# Patient Record
Sex: Male | Born: 1965 | Race: White | Hispanic: No | Marital: Single | Smoking: Never smoker
Health system: Southern US, Community
[De-identification: ages and names within clinical notes are randomized; demographics above are authoritative.]

## PROBLEM LIST (undated history)

## (undated) DIAGNOSIS — Z6841 Body Mass Index (BMI) 40.0 and over, adult: Secondary | ICD-10-CM

## (undated) DIAGNOSIS — N200 Calculus of kidney: Secondary | ICD-10-CM

## (undated) DIAGNOSIS — N289 Disorder of kidney and ureter, unspecified: Secondary | ICD-10-CM

## (undated) DIAGNOSIS — R7303 Prediabetes: Secondary | ICD-10-CM

## (undated) DIAGNOSIS — Z6838 Body mass index (BMI) 38.0-38.9, adult: Secondary | ICD-10-CM

## (undated) DIAGNOSIS — I10 Essential (primary) hypertension: Secondary | ICD-10-CM

## (undated) DIAGNOSIS — K567 Ileus, unspecified: Secondary | ICD-10-CM

## (undated) DIAGNOSIS — K9189 Other postprocedural complications and disorders of digestive system: Secondary | ICD-10-CM

---

## 2011-12-28 ENCOUNTER — Encounter (HOSPITAL_BASED_OUTPATIENT_CLINIC_OR_DEPARTMENT_OTHER): Payer: Self-pay | Admitting: *Deleted

## 2011-12-28 ENCOUNTER — Emergency Department (HOSPITAL_BASED_OUTPATIENT_CLINIC_OR_DEPARTMENT_OTHER): Payer: Self-pay

## 2011-12-28 ENCOUNTER — Inpatient Hospital Stay (HOSPITAL_BASED_OUTPATIENT_CLINIC_OR_DEPARTMENT_OTHER)
Admission: EM | Admit: 2011-12-28 | Discharge: 2012-01-07 | DRG: 339 | Disposition: A | Discharge status: Home / Self Care | Payer: MEDICAID | Attending: General Surgery | Admitting: General Surgery

## 2011-12-28 ENCOUNTER — Encounter (HOSPITAL_COMMUNITY): Admission: EM | Disposition: A | Discharge status: Home / Self Care | Payer: Self-pay | Source: Home / Self Care

## 2011-12-28 ENCOUNTER — Emergency Department (HOSPITAL_COMMUNITY): Payer: Self-pay | Admitting: Anesthesiology

## 2011-12-28 ENCOUNTER — Encounter (HOSPITAL_COMMUNITY): Payer: Self-pay | Admitting: Anesthesiology

## 2011-12-28 DIAGNOSIS — R7309 Other abnormal glucose: Secondary | ICD-10-CM | POA: Diagnosis present

## 2011-12-28 DIAGNOSIS — K3532 Acute appendicitis with perforation and localized peritonitis, without abscess: Secondary | ICD-10-CM | POA: Diagnosis present

## 2011-12-28 DIAGNOSIS — N289 Disorder of kidney and ureter, unspecified: Secondary | ICD-10-CM | POA: Clinically undetermined

## 2011-12-28 DIAGNOSIS — K3533 Acute appendicitis with perforation and localized peritonitis, with abscess: Principal | ICD-10-CM | POA: Diagnosis present

## 2011-12-28 DIAGNOSIS — K560 Paralytic ileus: Secondary | ICD-10-CM | POA: Diagnosis not present

## 2011-12-28 DIAGNOSIS — K567 Ileus, unspecified: Secondary | ICD-10-CM | POA: Clinically undetermined

## 2011-12-28 DIAGNOSIS — K3580 Unspecified acute appendicitis: Secondary | ICD-10-CM

## 2011-12-28 DIAGNOSIS — R7303 Prediabetes: Secondary | ICD-10-CM

## 2011-12-28 DIAGNOSIS — N179 Acute kidney failure, unspecified: Secondary | ICD-10-CM | POA: Diagnosis not present

## 2011-12-28 DIAGNOSIS — N200 Calculus of kidney: Secondary | ICD-10-CM | POA: Diagnosis present

## 2011-12-28 DIAGNOSIS — Z6838 Body mass index (BMI) 38.0-38.9, adult: Secondary | ICD-10-CM

## 2011-12-28 DIAGNOSIS — K352 Acute appendicitis with generalized peritonitis, without abscess: Secondary | ICD-10-CM

## 2011-12-28 DIAGNOSIS — E860 Dehydration: Secondary | ICD-10-CM | POA: Diagnosis not present

## 2011-12-28 DIAGNOSIS — I10 Essential (primary) hypertension: Secondary | ICD-10-CM

## 2011-12-28 HISTORY — DX: Prediabetes: R73.03

## 2011-12-28 HISTORY — DX: Ileus, unspecified: K56.7

## 2011-12-28 HISTORY — DX: Disorder of kidney and ureter, unspecified: N28.9

## 2011-12-28 HISTORY — DX: Other postprocedural complications and disorders of digestive system: K91.89

## 2011-12-28 HISTORY — DX: Body mass index (bmi) 38.0-38.9, adult: Z68.38

## 2011-12-28 HISTORY — DX: Calculus of kidney: N20.0

## 2011-12-28 HISTORY — PX: LAPAROSCOPIC APPENDECTOMY: SHX408

## 2011-12-28 HISTORY — DX: Body Mass Index (BMI) 40.0 and over, adult: Z684

## 2011-12-28 HISTORY — DX: Essential (primary) hypertension: I10

## 2011-12-28 HISTORY — DX: Morbid (severe) obesity due to excess calories: E66.01

## 2011-12-28 LAB — COMPREHENSIVE METABOLIC PANEL
AST: 14 U/L (ref 0–37)
Albumin: 3.5 g/dL (ref 3.5–5.2)
BUN: 46 mg/dL — ABNORMAL HIGH (ref 6–23)
CO2: 28 mEq/L (ref 19–32)
Calcium: 10.2 mg/dL (ref 8.4–10.5)
Chloride: 89 mEq/L — ABNORMAL LOW (ref 96–112)
Creatinine, Ser: 1.9 mg/dL — ABNORMAL HIGH (ref 0.50–1.35)
GFR calc non Af Amer: 40 mL/min — ABNORMAL LOW (ref >90)
Total Bilirubin: 1.6 mg/dL — ABNORMAL HIGH (ref 0.3–1.2)

## 2011-12-28 LAB — CBC WITH DIFFERENTIAL/PLATELET
Band Neutrophils: 0 % (ref 0–10)
Basophils Relative: 0 % (ref 0–1)
Eosinophils Absolute: 0 10*3/uL (ref 0.0–0.7)
Eosinophils Relative: 0 % (ref 0–5)
HCT: 48.5 % (ref 39.0–52.0)
Hemoglobin: 17.7 g/dL — ABNORMAL HIGH (ref 13.0–17.0)
Lymphocytes Relative: 15 % (ref 12–46)
Lymphs Abs: 3.3 10*3/uL (ref 0.7–4.0)
MCV: 82.3 fL (ref 78.0–100.0)
Metamyelocytes Relative: 0 %
Monocytes Absolute: 0.9 10*3/uL (ref 0.1–1.0)
Monocytes Relative: 4 % (ref 3–12)
RBC: 5.89 MIL/uL — ABNORMAL HIGH (ref 4.22–5.81)
WBC: 21.9 10*3/uL — ABNORMAL HIGH (ref 4.0–10.5)

## 2011-12-28 LAB — URINALYSIS, ROUTINE W REFLEX MICROSCOPIC
Protein, ur: 100 mg/dL — AB
Specific Gravity, Urine: 1.037 — ABNORMAL HIGH (ref 1.005–1.030)
Urobilinogen, UA: 1 mg/dL (ref 0.0–1.0)

## 2011-12-28 LAB — URINE MICROSCOPIC-ADD ON

## 2011-12-28 LAB — LIPASE, BLOOD: Lipase: 41 U/L (ref 11–59)

## 2011-12-28 SURGERY — APPENDECTOMY, LAPAROSCOPIC
Anesthesia: General | Site: Abdomen | Wound class: Dirty or Infected

## 2011-12-28 MED ORDER — ROCURONIUM BROMIDE 100 MG/10ML IV SOLN
INTRAVENOUS | Status: DC | PRN
  Administered 2011-12-28: 35 mg via INTRAVENOUS
  Administered 2011-12-28: 10 mg via INTRAVENOUS

## 2011-12-28 MED ORDER — SODIUM CHLORIDE 0.9 % IV SOLN
1.0000 g | Freq: Once | INTRAVENOUS | Status: AC
  Administered 2011-12-28: 1 g via INTRAVENOUS
  Filled 2011-12-28: qty 1

## 2011-12-28 MED ORDER — MORPHINE SULFATE (PF) 1 MG/ML IV SOLN
INTRAVENOUS | Status: DC
  Administered 2011-12-28: 21:00:00 via INTRAVENOUS

## 2011-12-28 MED ORDER — SODIUM CHLORIDE 0.9 % IV BOLUS (SEPSIS)
2000.0000 mL | Freq: Once | INTRAVENOUS | Status: AC
  Administered 2011-12-28: 1000 mL via INTRAVENOUS

## 2011-12-28 MED ORDER — SODIUM CHLORIDE 0.9 % IV SOLN
Freq: Once | INTRAVENOUS | Status: AC
  Administered 2011-12-28: 15:00:00 via INTRAVENOUS

## 2011-12-28 MED ORDER — MIDAZOLAM HCL 5 MG/5ML IJ SOLN
INTRAMUSCULAR | Status: DC | PRN
  Administered 2011-12-28: 2 mg via INTRAVENOUS

## 2011-12-28 MED ORDER — LACTATED RINGERS IR SOLN
Status: DC | PRN
  Administered 2011-12-28: 1000 mL

## 2011-12-28 MED ORDER — FENTANYL CITRATE 0.05 MG/ML IJ SOLN
INTRAMUSCULAR | Status: DC | PRN
  Administered 2011-12-28: 50 ug via INTRAVENOUS
  Administered 2011-12-28: 100 ug via INTRAVENOUS
  Administered 2011-12-28 (×2): 50 ug via INTRAVENOUS

## 2011-12-28 MED ORDER — SODIUM CHLORIDE 0.9 % IV SOLN
1.0000 g | INTRAVENOUS | Status: DC
  Administered 2011-12-29 – 2012-01-06 (×9): 1 g via INTRAVENOUS
  Filled 2011-12-28 (×10): qty 1

## 2011-12-28 MED ORDER — SODIUM CHLORIDE 0.9 % IV SOLN
INTRAVENOUS | Status: DC
  Administered 2011-12-29 – 2011-12-30 (×6): via INTRAVENOUS

## 2011-12-28 MED ORDER — ONDANSETRON HCL 4 MG/2ML IJ SOLN: 4.0000 mg | Freq: Four times a day (QID) | INTRAMUSCULAR | Status: DC | PRN

## 2011-12-28 MED ORDER — LIDOCAINE-EPINEPHRINE 1 %-1:100000 IJ SOLN
INTRAMUSCULAR | Status: DC | PRN
  Administered 2011-12-28: 30 mL

## 2011-12-28 MED ORDER — LIDOCAINE-EPINEPHRINE 1 %-1:100000 IJ SOLN
INTRAMUSCULAR | Status: AC
  Filled 2011-12-28: qty 1

## 2011-12-28 MED ORDER — PROPOFOL 10 MG/ML IV EMUL
INTRAVENOUS | Status: DC | PRN
  Administered 2011-12-28: 200 mg via INTRAVENOUS

## 2011-12-28 MED ORDER — ACETAMINOPHEN 325 MG PO TABS
650.0000 mg | ORAL_TABLET | Freq: Once | ORAL | Status: AC
  Administered 2011-12-28: 650 mg via ORAL

## 2011-12-28 MED ORDER — DIPHENHYDRAMINE HCL 50 MG/ML IJ SOLN: 12.5000 mg | Freq: Four times a day (QID) | INTRAMUSCULAR | Status: DC | PRN

## 2011-12-28 MED ORDER — GLYCOPYRROLATE 0.2 MG/ML IJ SOLN
INTRAMUSCULAR | Status: DC | PRN
  Administered 2011-12-28: 0.6 mg via INTRAVENOUS

## 2011-12-28 MED ORDER — MORPHINE SULFATE (PF) 1 MG/ML IV SOLN
INTRAVENOUS | Status: AC
  Filled 2011-12-28: qty 25

## 2011-12-28 MED ORDER — HYDROMORPHONE HCL PF 1 MG/ML IJ SOLN: 0.2500 mg | INTRAMUSCULAR | Status: DC | PRN

## 2011-12-28 MED ORDER — SODIUM CHLORIDE 0.9 % IV SOLN
Freq: Once | INTRAVENOUS | Status: AC
  Administered 2011-12-28: 1000 mL via INTRAVENOUS

## 2011-12-28 MED ORDER — SODIUM CHLORIDE 0.9 % IJ SOLN: 9.0000 mL | INTRAMUSCULAR | Status: DC | PRN

## 2011-12-28 MED ORDER — DEXAMETHASONE SODIUM PHOSPHATE 10 MG/ML IJ SOLN
INTRAMUSCULAR | Status: DC | PRN
  Administered 2011-12-28: 10 mg via INTRAVENOUS

## 2011-12-28 MED ORDER — BUPIVACAINE HCL (PF) 0.25 % IJ SOLN
INTRAMUSCULAR | Status: AC
  Filled 2011-12-28: qty 30

## 2011-12-28 MED ORDER — ONDANSETRON HCL 4 MG/2ML IJ SOLN
4.0000 mg | Freq: Once | INTRAMUSCULAR | Status: AC
  Administered 2011-12-28: 4 mg via INTRAVENOUS

## 2011-12-28 MED ORDER — NALOXONE HCL 0.4 MG/ML IJ SOLN: 0.4000 mg | INTRAMUSCULAR | Status: DC | PRN

## 2011-12-28 MED ORDER — HEPARIN SODIUM (PORCINE) 5000 UNIT/ML IJ SOLN
5000.0000 [IU] | Freq: Three times a day (TID) | INTRAMUSCULAR | Status: DC
  Administered 2011-12-28 – 2012-01-06 (×26): 5000 [IU] via SUBCUTANEOUS
  Filled 2011-12-28 (×29): qty 1

## 2011-12-28 MED ORDER — DIPHENHYDRAMINE HCL 12.5 MG/5ML PO ELIX
12.5000 mg | ORAL_SOLUTION | Freq: Four times a day (QID) | ORAL | Status: DC | PRN
  Filled 2011-12-28: qty 5

## 2011-12-28 MED ORDER — SUCCINYLCHOLINE CHLORIDE 20 MG/ML IJ SOLN
INTRAMUSCULAR | Status: DC | PRN
  Administered 2011-12-28: 100 mg via INTRAVENOUS

## 2011-12-28 MED ORDER — EPHEDRINE SULFATE 50 MG/ML IJ SOLN
INTRAMUSCULAR | Status: DC | PRN
  Administered 2011-12-28: 15 mg via INTRAVENOUS

## 2011-12-28 MED ORDER — LACTATED RINGERS IV SOLN
INTRAVENOUS | Status: DC | PRN
  Administered 2011-12-28: 18:00:00 via INTRAVENOUS

## 2011-12-28 MED ORDER — NEOSTIGMINE METHYLSULFATE 1 MG/ML IJ SOLN
INTRAMUSCULAR | Status: DC | PRN
  Administered 2011-12-28: 5 mg via INTRAVENOUS

## 2011-12-28 MED ORDER — ONDANSETRON HCL 4 MG/2ML IJ SOLN
INTRAMUSCULAR | Status: AC
  Filled 2011-12-28: qty 2

## 2011-12-28 MED ORDER — BUPIVACAINE HCL 0.25 % IJ SOLN
INTRAMUSCULAR | Status: DC | PRN
  Administered 2011-12-28: 30 mL

## 2011-12-28 MED ORDER — ONDANSETRON HCL 4 MG PO TABS: 4.0000 mg | ORAL_TABLET | Freq: Four times a day (QID) | ORAL | Status: DC | PRN

## 2011-12-28 MED ORDER — ONDANSETRON HCL 4 MG/2ML IJ SOLN
INTRAMUSCULAR | Status: DC | PRN
  Administered 2011-12-28: 4 mg via INTRAVENOUS

## 2011-12-28 MED ORDER — ACETAMINOPHEN 325 MG PO TABS
ORAL_TABLET | ORAL | Status: AC
  Filled 2011-12-28: qty 2

## 2011-12-28 MED ORDER — KETOROLAC TROMETHAMINE 30 MG/ML IJ SOLN
30.0000 mg | Freq: Once | INTRAMUSCULAR | Status: AC
Start: 2011-12-28 — End: 2011-12-28
  Administered 2011-12-28: 30 mg via INTRAVENOUS
  Filled 2011-12-28: qty 1

## 2011-12-28 MED ORDER — 0.9 % SODIUM CHLORIDE (POUR BTL) OPTIME
TOPICAL | Status: DC | PRN
  Administered 2011-12-28: 1000 mL

## 2011-12-28 SURGICAL SUPPLY — 44 items
BENZOIN TINCTURE PRP APPL 2/3 (GAUZE/BANDAGES/DRESSINGS) ×2 IMPLANT
CABLE HIGH FREQUENCY MONO STRZ (ELECTRODE) ×2 IMPLANT
CANISTER SUCTION 2500CC (MISCELLANEOUS) ×2 IMPLANT
CHLORAPREP W/TINT 26ML (MISCELLANEOUS) ×2 IMPLANT
CLOTH BEACON ORANGE TIMEOUT ST (SAFETY) ×2 IMPLANT
CLSR STERI-STRIP ANTIMIC 1/2X4 (GAUZE/BANDAGES/DRESSINGS) ×2 IMPLANT
COVER SURGICAL LIGHT HANDLE (MISCELLANEOUS) ×2 IMPLANT
DECANTER SPIKE VIAL GLASS SM (MISCELLANEOUS) ×2 IMPLANT
DERMABOND ADVANCED (GAUZE/BANDAGES/DRESSINGS) ×1
DERMABOND ADVANCED .7 DNX12 (GAUZE/BANDAGES/DRESSINGS) ×1 IMPLANT
DRAIN CHANNEL 19F RND (DRAIN) ×2 IMPLANT
DRAPE LAPAROSCOPIC ABDOMINAL (DRAPES) ×2 IMPLANT
ELECT CAUTERY BLADE 6.4 (BLADE) ×2 IMPLANT
ELECT REM PT RETURN 9FT ADLT (ELECTROSURGICAL) ×2
ELECTRODE REM PT RTRN 9FT ADLT (ELECTROSURGICAL) ×1 IMPLANT
ENDO GIA UNIVERSAL XLG (ENDOMECHANICALS) ×2 IMPLANT
EVACUATOR SILICONE 100CC (DRAIN) ×2 IMPLANT
FILTER SMOKE EVAC LAPAROSHD (FILTER) IMPLANT
GLOVE BIOGEL PI IND STRL 7.0 (GLOVE) ×1 IMPLANT
GLOVE BIOGEL PI INDICATOR 7.0 (GLOVE) ×1
GLOVE SURG SS PI 7.5 STRL IVOR (GLOVE) ×8 IMPLANT
GOWN PREVENTION PLUS LG XLONG (DISPOSABLE) ×2 IMPLANT
GOWN STRL NON-REIN LRG LVL3 (GOWN DISPOSABLE) ×2 IMPLANT
GOWN STRL REIN XL XLG (GOWN DISPOSABLE) ×4 IMPLANT
GRASPER LAPSCPC 5X35 EPIX (ENDOMECHANICALS) IMPLANT
KIT BASIN OR (CUSTOM PROCEDURE TRAY) ×2 IMPLANT
NS IRRIG 1000ML POUR BTL (IV SOLUTION) ×2 IMPLANT
PENCIL BUTTON HOLSTER BLD 10FT (ELECTRODE) ×2 IMPLANT
POUCH SPECIMEN RETRIEVAL 10MM (ENDOMECHANICALS) ×2 IMPLANT
RELOAD EGIA 45 MED/THCK PURPLE (STAPLE) ×2 IMPLANT
RELOAD EGIA 45 TAN VASC (STAPLE) IMPLANT
SCALPEL HARMONIC ACE (MISCELLANEOUS) ×2 IMPLANT
SCISSORS LAP 5X35 DISP (ENDOMECHANICALS) ×2 IMPLANT
SET IRRIG TUBING LAPAROSCOPIC (IRRIGATION / IRRIGATOR) ×2 IMPLANT
SLEEVE Z-THREAD 5X100MM (TROCAR) ×2 IMPLANT
SOLUTION ANTI FOG 6CC (MISCELLANEOUS) ×2 IMPLANT
SUT ETHILON 2 0 PS N (SUTURE) ×2 IMPLANT
SUT MNCRL AB 4-0 PS2 18 (SUTURE) ×2 IMPLANT
TOWEL OR 17X26 10 PK STRL BLUE (TOWEL DISPOSABLE) ×2 IMPLANT
TRAY FOLEY CATH 14FRSI W/METER (CATHETERS) ×2 IMPLANT
TRAY LAP CHOLE (CUSTOM PROCEDURE TRAY) ×2 IMPLANT
TROCAR BALLN 12MMX100 BLUNT (TROCAR) ×2 IMPLANT
TROCAR Z-THREAD FIOS 5X100MM (TROCAR) ×6 IMPLANT
TUBING INSUFFLATION 10FT LAP (TUBING) ×2 IMPLANT

## 2011-12-28 NOTE — Anesthesia Postprocedure Evaluation (Signed)
  Anesthesia Post-op Note  Patient: Steve Rogers  Procedure(s) Performed: Procedure(s) (LRB): APPENDECTOMY LAPAROSCOPIC (N/A)  Patient Location: PACU  Anesthesia Type: General  Level of Consciousness: oriented and sedated  Airway and Oxygen Therapy: Patient Spontanous Breathing and Patient connected to nasal cannula oxygen  Post-op Pain: mild  Post-op Assessment: Post-op Vital signs reviewed, Patient's Cardiovascular Status Stable, Respiratory Function Stable and Patent Airway  Post-op Vital Signs: stable  Complications: No apparent anesthesia complications

## 2011-12-28 NOTE — Transfer of Care (Signed)
Immediate Anesthesia Transfer of Care Note  Patient: Steve Rogers  Procedure(s) Performed: Procedure(s) (LRB): APPENDECTOMY LAPAROSCOPIC (N/A)  Patient Location: PACU  Anesthesia Type: General  Level of Consciousness: sedated, patient cooperative and responds to stimulaton  Airway & Oxygen Therapy: Patient Spontanous Breathing and Patient connected to face mask oxgen  Post-op Assessment: Report given to PACU RN and Post -op Vital signs reviewed and stable  Post vital signs: Reviewed and stable  Complications: No apparent anesthesia complications

## 2011-12-28 NOTE — H&P (Signed)
Reason for Consult:appendicitis Referring Physician: Dr. Colin Ina is an 46 y.o. male.  HPI: this patient was transferred from [facility] for evaluation of abdominal pain and to rule out appendicitis. He was in his usual state of health until Thursday afternoon when he began having nausea and vomiting. Symptoms lasted about 24 hours. That his nausea and vomiting improved but he began having generalized abdominal pain which he describes as "soreness".  He thought that this was due to all of the vomiting. He says that this has progressively increased and he has not been able to take much food or drink. He has had fevers and chills as well. He has been constipated but took some stool softeners and had 3 bowel movements today. He denies any blood in the stools or melena. At that center [facility] he had a CT scan which was concerning for acute appendicitis. Over there, he was tachycardic with his heart rate in the 130s and showed evidence of dehydration. He received some fluid antibiotic and was transferred for surgical evaluation.  History reviewed. No pertinent past medical history.  History reviewed. No pertinent past surgical history.  No family history on file.  Social History:  reports that he has never smoked. He does not have any smokeless tobacco history on file. His alcohol and drug histories not on file.  Allergies: No Known Allergies  Medications: I have reviewed the patient's current medications.  Results for orders placed during the hospital encounter of 12/28/11 (from the past 48 hour(s))  CBC WITH DIFFERENTIAL     Status: Abnormal   Collection Time   12/28/11 12:29 PM      Component Value Range Comment   WBC 21.9 (*) 4.0 - 10.5 K/uL WHITE COUNT CONFIRMED ON SMEAR   RBC 5.89 (*) 4.22 - 5.81 MIL/uL    Hemoglobin 17.7 (*) 13.0 - 17.0 g/dL    HCT 16.1  09.6 - 04.5 %    MCV 82.3  78.0 - 100.0 fL    MCH 30.1  26.0 - 34.0 pg    MCHC 36.5 (*) 30.0 - 36.0 g/dL      RDW 40.9  81.1 - 91.4 %    Platelets 410 (*) 150 - 400 K/uL    Neutrophils Relative 81 (*) 43 - 77 %    Lymphocytes Relative 15  12 - 46 %    Monocytes Relative 4  3 - 12 %    Eosinophils Relative 0  0 - 5 %    Basophils Relative 0  0 - 1 %    Band Neutrophils 0  0 - 10 %    Metamyelocytes Relative 0      Myelocytes 0      Promyelocytes Absolute 0      Blasts 0      nRBC 0  0 /100 WBC    Neutro Abs 17.7 (*) 1.7 - 7.7 K/uL    Lymphs Abs 3.3  0.7 - 4.0 K/uL    Monocytes Absolute 0.9  0.1 - 1.0 K/uL    Eosinophils Absolute 0.0  0.0 - 0.7 K/uL    Basophils Absolute 0.0  0.0 - 0.1 K/uL    Smear Review LARGE PLATELETS PRESENT     LIPASE, BLOOD     Status: Normal   Collection Time   12/28/11 12:29 PM      Component Value Range Comment   Lipase 41  11 - 59 U/L   COMPREHENSIVE METABOLIC PANEL  Status: Abnormal   Collection Time   12/28/11 12:29 PM      Component Value Range Comment   Sodium 132 (*) 135 - 145 mEq/L    Potassium 3.5  3.5 - 5.1 mEq/L    Chloride 89 (*) 96 - 112 mEq/L    CO2 28  19 - 32 mEq/L    Glucose, Bld 182 (*) 70 - 99 mg/dL    BUN 46 (*) 6 - 23 mg/dL    Creatinine, Ser 9.60 (*) 0.50 - 1.35 mg/dL    Calcium 45.4  8.4 - 10.5 mg/dL    Total Protein 8.3  6.0 - 8.3 g/dL    Albumin 3.5  3.5 - 5.2 g/dL    AST 14  0 - 37 U/L    ALT 26  0 - 53 U/L    Alkaline Phosphatase 72  39 - 117 U/L    Total Bilirubin 1.6 (*) 0.3 - 1.2 mg/dL    GFR calc non Af Amer 40 (*) >90 mL/min    GFR calc Af Amer 47 (*) >90 mL/min   URINALYSIS, ROUTINE W REFLEX MICROSCOPIC     Status: Abnormal   Collection Time   12/28/11  1:07 PM      Component Value Range Comment   Color, Urine AMBER (*) YELLOW BIOCHEMICALS MAY BE AFFECTED BY COLOR   APPearance TURBID (*) CLEAR    Specific Gravity, Urine 1.037 (*) 1.005 - 1.030    pH 5.0  5.0 - 8.0    Glucose, UA NEGATIVE  NEGATIVE mg/dL    Hgb urine dipstick LARGE (*) NEGATIVE    Bilirubin Urine MODERATE (*) NEGATIVE    Ketones, ur 15 (*) NEGATIVE  mg/dL    Protein, ur 098 (*) NEGATIVE mg/dL    Urobilinogen, UA 1.0  0.0 - 1.0 mg/dL    Nitrite POSITIVE (*) NEGATIVE    Leukocytes, UA SMALL (*) NEGATIVE   URINE MICROSCOPIC-ADD ON     Status: Abnormal   Collection Time   12/28/11  1:07 PM      Component Value Range Comment   Squamous Epithelial / LPF RARE  RARE    WBC, UA 3-6  <3 WBC/hpf    RBC / HPF 11-20  <3 RBC/hpf    Bacteria, UA MANY (*) RARE    Casts HYALINE CASTS (*) NEGATIVE     Ct Abdomen Pelvis Wo Contrast  12/28/2011  *RADIOLOGY REPORT*  Clinical Data: Abdominal pain, nausea, elevated creatinine  CT ABDOMEN AND PELVIS WITHOUT CONTRAST  Technique:  Multidetector CT imaging of the abdomen and pelvis was performed following the standard protocol without intravenous contrast.  Comparison: None.  Findings: Scarring or subsegmental atelectasis posteriorly in the visualized lung bases.  Unremarkable uninfused evaluation of liver, gallbladder, adrenal glands, pancreas.  There is high attenuation material in the lumen of the gallbladder.  Left nephrolithiasis, 6 mm calculus in the lower pole renal collecting system.  2 cm fluid attenuation lesion in the interpolar region left kidney. No hydronephrosis.  Ureters decompressed without calculus.  Stomach is nondistended.  There are multiple mid abdominal distended small bowel loops and a single dilated loop of small bowel in the mid abdomen measuring 5 cm transverse diameter.  Loops of more distal ileum are surrounded by mild inflammatory/edematous changes.  The appendix is dilated up to 18 mm diameter, with mild wall thickening, containing gas and appendicoliths with adjacent inflammatory/edematous change.  No definite extraluminal fluid collections.  The colon is decompressed.  Urinary bladder  incompletely distended.  No free air.  No ascites.  IMPRESSION:  1.  Distended appendix with appendicoliths, wall thickening, and adjacent inflammatory/inflammatory changes suggesting acute appendicitis.  No  evidence of perforation or abscess. 2.  Left nephrolithiasis without hydronephrosis.  Original Report Authenticated By: Osa Craver, M.D.    All other review of systems negative or noncontributory except as stated in the HPI'  Blood pressure 150/87, pulse 106, temperature 98.5 F (36.9 C), temperature source Oral, SpO2 97.00%. General appearance: alert, cooperative and no distress Head: Normocephalic, without obvious abnormality, atraumatic Neck: no JVD and supple, symmetrical, trachea midline Resp: clear to auscultation bilaterally Cardio: mild tachy, regular GI: soft, diffuse mild tenderness, no focal tenderness, mild distension, no peritoneal signs Extremities: extremities normal, atraumatic, no cyanosis or edema Neurologic: Grossly normal  Assessment/Plan: Abdominal pain, likely acute appendicitis. He has had 3 days of abdominal pain and nausea and vomiting and fevers and chills and a history and physical exam concerning for acute appendicitis. Given at a CT scan and his laboratory studies I have recommended diagnostic laparoscopy and appendectomy for treatment of appendicitis. I explained that I have concern for possible perforation given the duration and has diffuse tenderness as well as the CT scan findings. I discussed with him the planned procedure and the risks the benefits and he expressed understanding and would like to proceed with appendectomy. I discussed with him the risks of infection, bleeding, pain, scarring, persistent symptoms, negative laparoscopy, need for open surgery, abscess, bowel injury and he expressed understanding and would like to proceed with diagnostic laparoscopy and appendectomy. In the meantime, we will provide IV hydration and antibiotics and we will proceed with appendectomy as soon as possible.  Lodema Pilot DAVID 12/28/2011, 4:25 PM

## 2011-12-28 NOTE — ED Notes (Signed)
[facility] has been called to transport patient to [facility] ED--[facility] is out of [place]

## 2011-12-28 NOTE — Anesthesia Preprocedure Evaluation (Signed)
Anesthesia Evaluation  Patient identified by MRN, date of birth, ID band Patient awake  General Assessment Comment:NPO today  Reviewed: Allergy & Precautions, H&P , NPO status , Patient's Chart, lab work & pertinent test results, reviewed documented beta blocker date and time   Airway Mallampati: II TM Distance: >3 FB     Dental  (+) Teeth Intact   Pulmonary neg pulmonary ROS,  breath sounds clear to auscultation        Cardiovascular negative cardio ROS  Rhythm:Regular Rate:Normal  Denies cardiac symptopms   Neuro/Psych negative neurological ROS  negative psych ROS   GI/Hepatic Neg liver ROS, appendicitis   Endo/Other  Morbid obesity  Renal/GU Elevated Cr 1.90  negative genitourinary   Musculoskeletal negative musculoskeletal ROS (+)   Abdominal   Peds negative pediatric ROS (+)  Hematology negative hematology ROS (+)   Anesthesia Other Findings   Reproductive/Obstetrics negative OB ROS                           Anesthesia Physical Anesthesia Plan  ASA: II and Emergent  Anesthesia Plan:    Post-op Pain Management:    Induction: Intravenous, Rapid sequence and Cricoid pressure planned  Airway Management Planned: Oral ETT  Additional Equipment:   Intra-op Plan:   Post-operative Plan: Extubation in OR  Informed Consent: I have reviewed the patients History and Physical, chart, labs and discussed the procedure including the risks, benefits and alternatives for the proposed anesthesia with the patient or authorized representative who has indicated his/her understanding and acceptance.   Dental advisory given  Plan Discussed with: CRNA and Surgeon  Anesthesia Plan Comments:         Anesthesia Quick Evaluation

## 2011-12-28 NOTE — ED Notes (Signed)
Report received from [facility]

## 2011-12-28 NOTE — ED Notes (Signed)
[id number]<AV> Expected date:12/28/11<BR> Expected time: 3:34 PM<BR> Means of arrival:Ambulance<BR> Comments:<BR> apendicitis

## 2011-12-28 NOTE — Brief Op Note (Signed)
12/28/2011  8:12 PM  PATIENT:  Steve Rogers  46 y.o. male  PRE-OPERATIVE DIAGNOSIS:  appendicitis  POST-OPERATIVE DIAGNOSIS:  appendicitis  PROCEDURE:  Procedure(s) (LRB): APPENDECTOMY LAPAROSCOPIC (N/A)  SURGEON:  Surgeon(s) and Role:    * Lodema Pilot, DO - Primary  PHYSICIAN ASSISTANT:   ASSISTANTS: none   ANESTHESIA:   general  EBL:  Total I/O In: 2100 [I.V.:2100] Out: 475 [Urine:175; Other:300]  BLOOD ADMINISTERED:none  DRAINS: (61F) Jackson-Pratt drain(s) with closed bulb suction in the abscess cavity   LOCAL MEDICATIONS USED:  MARCAINE    and LIDOCAINE   SPECIMEN:  Source of Specimen:  appendix  DISPOSITION OF SPECIMEN:  PATHOLOGY  COUNTS:  YES  TOURNIQUET:  * No tourniquets in log *  DICTATION: .Other Dictation: Dictation Number   PLAN OF CARE: Admit to inpatient   PATIENT DISPOSITION:  PACU - hemodynamically stable.   Delay start of Pharmacological VTE agent (>24hrs) due to surgical blood loss or risk of bleeding: no

## 2011-12-28 NOTE — ED Notes (Signed)
Patient has had N/V/D since Thursday. Unable to hold fluids down at all.

## 2011-12-28 NOTE — ED Provider Notes (Signed)
History     CSN: [id number]  Arrival date & time 12/28/11  1148   First MD Initiated Contact with Patient 12/28/11 1222      Chief Complaint  Patient presents with  . Emesis    (Consider location/radiation/quality/duration/timing/severity/associated sxs/prior treatment) HPI Comments: 3 day history of not n/v/d, all non-bloody.  States has been unable to keep anything down.  Unsure if he ate something bad.  No sick contacts.    Patient is a 46 y.o. male presenting with vomiting. The history is provided by the patient.  Emesis  This is a new problem. Episode onset: 3 days ago. The problem occurs continuously. The problem has been gradually worsening. The emesis has an appearance of stomach contents. There has been no fever. Associated symptoms include chills and diarrhea. Pertinent negatives include no abdominal pain and no fever.    History reviewed. No pertinent past medical history.  History reviewed. No pertinent past surgical history.  No family history on file.  History  Substance Use Topics  . Smoking status: Never Smoker   . Smokeless tobacco: Not on file  . Alcohol Use:       Review of Systems  Constitutional: Positive for chills. Negative for fever.  Gastrointestinal: Positive for vomiting and diarrhea. Negative for abdominal pain.  All other systems reviewed and are negative.    Allergies  Review of patient's allergies indicates no known allergies.  Home Medications  No current outpatient prescriptions on file.  BP 138/94  Pulse 139  Temp 99.3 F (37.4 C) (Oral)  SpO2 97%  Physical Exam  Nursing note and vitals reviewed. Constitutional: He is oriented to person, place, and time. He appears well-developed and well-nourished. No distress.  HENT:  Head: Normocephalic and atraumatic.  Neck: Normal range of motion. Neck supple.  Cardiovascular: Regular rhythm.        Tachycardic in the 120's.  Pulmonary/Chest: Effort normal and breath sounds normal.  No respiratory distress.  Abdominal: Soft. Bowel sounds are normal. He exhibits no distension. There is no tenderness.  Musculoskeletal: Normal range of motion. He exhibits no edema.  Neurological: He is alert and oriented to person, place, and time.  Skin: Skin is warm and dry. He is not diaphoretic.    ED Course  Procedures (including critical care time)   Labs Reviewed  CBC WITH DIFFERENTIAL  LIPASE, BLOOD  COMPREHENSIVE METABOLIC PANEL  URINALYSIS, ROUTINE W REFLEX MICROSCOPIC   No results found.   No diagnosis found.    MDM  The patient presents with abd pain for the past three days.  He has been unable to eat or drink due to nausea.  The workup today reveals a leukocytosis, elevated bun/cr reflective of dehydration, and a ct scan that shows acute appendicitis.  He has been hydrated with 2L of NS.  I will consult general surgery to discuss transfer.  Patient accepted by Dr. Biagio Quint to [facility].        Geoffery Lyons, MD 12/28/11 1459

## 2011-12-29 ENCOUNTER — Encounter (HOSPITAL_COMMUNITY): Payer: Self-pay | Admitting: General Surgery

## 2011-12-29 LAB — COMPREHENSIVE METABOLIC PANEL
AST: 13 U/L (ref 0–37)
Albumin: 2.4 g/dL — ABNORMAL LOW (ref 3.5–5.2)
Alkaline Phosphatase: 60 U/L (ref 39–117)
Chloride: 99 mEq/L (ref 96–112)
Creatinine, Ser: 1.4 mg/dL — ABNORMAL HIGH (ref 0.50–1.35)
Potassium: 4.3 mEq/L (ref 3.5–5.1)
Total Bilirubin: 0.8 mg/dL (ref 0.3–1.2)
Total Protein: 6.1 g/dL (ref 6.0–8.3)

## 2011-12-29 LAB — CBC
HCT: 41.6 % (ref 39.0–52.0)
MCH: 29.4 pg (ref 26.0–34.0)
MCV: 86.1 fL (ref 78.0–100.0)
Platelets: 322 10*3/uL (ref 150–400)
RBC: 4.83 MIL/uL (ref 4.22–5.81)
RDW: 13.2 % (ref 11.5–15.5)
WBC: 14.6 10*3/uL — ABNORMAL HIGH (ref 4.0–10.5)

## 2011-12-29 MED ORDER — FAMOTIDINE IN NACL 20-0.9 MG/50ML-% IV SOLN
20.0000 mg | INTRAVENOUS | Status: DC
  Administered 2011-12-29 – 2012-01-06 (×9): 20 mg via INTRAVENOUS
  Filled 2011-12-29 (×11): qty 50

## 2011-12-29 NOTE — Discharge Summary (Signed)
General surgery attending note:  Patient interviewed and examined. I agree with treatment plan and assessment as outlined by Mr. [name].  On exam, abdomen is distended but relatively soft. Bowel sounds absent. JP draining serosanguineous fluid.  Continue NPO. Continue Invanz for several days.     Begin to ambulate. Incentive spirometry were ordered. Will get Foley catheter out today. He still has ileus and will be to leave the NG tube in. I discussed his operative findings with him. He seems to understand these issues.   Angelia Mould. Derrell Lolling, M.D., [facility] General and Minimally invasive Surgery Breast and Colorectal Surgery Office:   [phone] Pager:   [phone]

## 2011-12-29 NOTE — Care Management Note (Signed)
    Page 1 of 1   01/07/2012     2:13:41 PM   CARE MANAGEMENT NOTE 01/07/2012  Patient:  Steve Rogers, Steve Rogers   Account Number:  [id number]  Date Initiated:  12/29/2011  Documentation initiated by:  Lorenda Ishihara  Subjective/Objective Assessment:   46 yo male admitted s/p lap appendectomy with abscess. PTA lived at home with parents.     Action/Plan:   Anticipated DC Date:  01/07/2012   Anticipated DC Plan:  HOME/SELF CARE      DC Planning Services  CM consult      Choice offered to / List presented to:             Status of service:  In process, will continue to follow Medicare Important Message given?   (If response is "NO", the following Medicare IM given date fields will be blank) Date Medicare IM given:   Date Additional Medicare IM given:    Discharge Disposition:  HOME/SELF CARE  Per UR Regulation:  Reviewed for med. necessity/level of care/duration of stay  If discussed at Long Length of Stay Meetings, dates discussed:    Comments:

## 2011-12-29 NOTE — Progress Notes (Signed)
1 Day Post-Op  Subjective:  No flatus, wound dressings are dry, not much coming thru NG. Will get oob, and start mobilizing  Objective:  Vital signs in last 24 hours:  Temp: [97.5 F (36.4 C)-99.3 F (37.4 C)] 98.4 F (36.9 C) (08/05 1002)  Pulse Rate: [87-139] 95 (08/05 1002)  Resp: [16-21] 16 (08/05 1006)  BP: (131-161)/(76-103) 143/82 mmHg (08/05 1002)  SpO2: [93 %-98 %] 98 % (08/05 1002)  Weight: [125.646 kg (277 lb)] 125.646 kg (277 lb) (08/04 2107)  Last BM Date: 12/27/11  125 ml/NG recorded. 110 ml/drain, afebrile, VSS, Creatinine improving, WBC improving  Intake/Output from previous day:  08/04 0701 - 08/05 0700  In: 3085 [I.V.:3085]  Out: 1360 [Urine:875; Emesis/NG output:75; Drains:110]  Intake/Output this shift:  Total I/O  In: -  Out: 475 [Urine:300; Emesis/NG output:125; Drains:50]  General appearance: alert, cooperative, no distress and looks pretty miserable.  Resp: clear to auscultation bilaterally  GI: distended obese, no bowel sounds. Drainage is serous-bloody.  Lab Results:   Basename  12/29/11 0432  12/28/11 1229   WBC  14.6*  21.9*   HGB  14.2  17.7*   HCT  41.6  48.5   PLT  322  410*    BMET   Basename  12/29/11 0432  12/28/11 1229   NA  135  132*   K  4.3  3.5   CL  99  89*   CO2  28  28   GLUCOSE  129*  182*   BUN  40*  46*   CREATININE  1.40*  1.90*   CALCIUM  8.8  10.2    PT/INR  No results found for this basename: LABPROT:2,INR:2 in the last 72 hours   Lab  12/29/11 0432  12/28/11 1229   AST  13  14   ALT  18  26   ALKPHOS  60  72   BILITOT  0.8  1.6*   PROT  6.1  8.3   ALBUMIN  2.4*  3.5    Lipase    Component  Value  Date/Time    LIPASE  41  12/28/2011 1229    Studies/Results:  Ct Abdomen Pelvis Wo Contrast  12/28/2011 *RADIOLOGY REPORT* Clinical Data: Abdominal pain, nausea, elevated creatinine CT ABDOMEN AND PELVIS WITHOUT CONTRAST Technique: Multidetector CT imaging of the abdomen and pelvis was performed following the  standard protocol without intravenous contrast. Comparison: None. Findings: Scarring or subsegmental atelectasis posteriorly in the visualized lung bases. Unremarkable uninfused evaluation of liver, gallbladder, adrenal glands, pancreas. There is high attenuation material in the lumen of the gallbladder. Left nephrolithiasis, 6 mm calculus in the lower pole renal collecting system. 2 cm fluid attenuation lesion in the interpolar region left kidney. No hydronephrosis. Ureters decompressed without calculus. Stomach is nondistended. There are multiple mid abdominal distended small bowel loops and a single dilated loop of small bowel in the mid abdomen measuring 5 cm transverse diameter. Loops of more distal ileum are surrounded by mild inflammatory/edematous changes. The appendix is dilated up to 18 mm diameter, with mild wall thickening, containing gas and appendicoliths with adjacent inflammatory/edematous change. No definite extraluminal fluid collections. The colon is decompressed. Urinary bladder incompletely distended. No free air. No ascites. IMPRESSION: 1. Distended appendix with appendicoliths, wall thickening, and adjacent inflammatory/inflammatory changes suggesting acute appendicitis. No evidence of perforation or abscess. 2. Left nephrolithiasis without hydronephrosis. Original Report Authenticated By: Osa Craver, M.D.   Medications:   .  sodium chloride  Intravenous  Once   .  sodium chloride   Intravenous  Once   .  acetaminophen  650 mg  Oral  Once   .  ertapenem  1 g  Intravenous  Once   .  ertapenem (INVANZ) IV  1 g  Intravenous  Q24H   .  heparin  5,000 Units  Subcutaneous  Q8H   .  ketorolac  30 mg  Intravenous  Once   .  morphine   Intravenous  Q4H   .  morphine      .  ondansetron (ZOFRAN) IV  4 mg  Intravenous  Once   .  sodium chloride  2,000 mL  Intravenous  Once    Assessment/Plan  Acute appendictis, with Lap appendectomy, and drain placement 12/28/11 Dr. Biagio Quint    BMI 38  Left Nephrolithiasis without hydronephrosis Plan: Mobilize, will discuss clamping NG, IS. Follow labs, we expect him to be sick post op with findings by DR. Layton.  This is actually a Progress note. I cannot Change it as an addendum.  175 from NG after they worked on the NG  LOS: 1 day  Steve Rogers  12/29/2011

## 2011-12-29 NOTE — Discharge Summary (Addendum)
1 Day Post-Op  Subjective: No flatus, wound dressings are dry, not much coming thru NG.  Will get oob, and start mobilizing  Objective: Vital signs in last 24 hours: Temp:  [97.5 F (36.4 C)-99.3 F (37.4 C)] 98.4 F (36.9 C) (08/05 1002) Pulse Rate:  [87-139] 95  (08/05 1002) Resp:  [16-21] 16  (08/05 1006) BP: (131-161)/(76-103) 143/82 mmHg (08/05 1002) SpO2:  [93 %-98 %] 98 % (08/05 1002) Weight:  [125.646 kg (277 lb)] 125.646 kg (277 lb) (08/04 2107) Last BM Date: 12/27/11  125 ml/NG recorded.  110 ml/drain, afebrile, VSS, Creatinine improving, WBC improving  Intake/Output from previous day: 08/04 0701 - 08/05 0700 In: 3085 [I.V.:3085] Out: 1360 [Urine:875; Emesis/NG output:75; Drains:110] Intake/Output this shift: Total I/O In: -  Out: 475 [Urine:300; Emesis/NG output:125; Drains:50]  General appearance: alert, cooperative, no distress and looks pretty miserable. Resp: clear to auscultation bilaterally GI: distended obese, no bowel sounds. Drainage is serous-bloody.  Lab Results:   Basename 12/29/11 0432 12/28/11 1229  WBC 14.6* 21.9*  HGB 14.2 17.7*  HCT 41.6 48.5  PLT 322 410*    BMET  Basename 12/29/11 0432 12/28/11 1229  NA 135 132*  K 4.3 3.5  CL 99 89*  CO2 28 28  GLUCOSE 129* 182*  BUN 40* 46*  CREATININE 1.40* 1.90*  CALCIUM 8.8 10.2   PT/INR No results found for this basename: LABPROT:2,INR:2 in the last 72 hours   Lab 12/29/11 0432 12/28/11 1229  AST 13 14  ALT 18 26  ALKPHOS 60 72  BILITOT 0.8 1.6*  PROT 6.1 8.3  ALBUMIN 2.4* 3.5     Lipase     Component Value Date/Time   LIPASE 41 12/28/2011 1229     Studies/Results: Ct Abdomen Pelvis Wo Contrast  12/28/2011  *RADIOLOGY REPORT*  Clinical Data: Abdominal pain, nausea, elevated creatinine  CT ABDOMEN AND PELVIS WITHOUT CONTRAST  Technique:  Multidetector CT imaging of the abdomen and pelvis was performed following the standard protocol without intravenous contrast.  Comparison:  None.  Findings: Scarring or subsegmental atelectasis posteriorly in the visualized lung bases.  Unremarkable uninfused evaluation of liver, gallbladder, adrenal glands, pancreas.  There is high attenuation material in the lumen of the gallbladder.  Left nephrolithiasis, 6 mm calculus in the lower pole renal collecting system.  2 cm fluid attenuation lesion in the interpolar region left kidney. No hydronephrosis.  Ureters decompressed without calculus.  Stomach is nondistended.  There are multiple mid abdominal distended small bowel loops and a single dilated loop of small bowel in the mid abdomen measuring 5 cm transverse diameter.  Loops of more distal ileum are surrounded by mild inflammatory/edematous changes.  The appendix is dilated up to 18 mm diameter, with mild wall thickening, containing gas and appendicoliths with adjacent inflammatory/edematous change.  No definite extraluminal fluid collections.  The colon is decompressed.  Urinary bladder incompletely distended.  No free air.  No ascites.  IMPRESSION:  1.  Distended appendix with appendicoliths, wall thickening, and adjacent inflammatory/inflammatory changes suggesting acute appendicitis.  No evidence of perforation or abscess. 2.  Left nephrolithiasis without hydronephrosis.  Original Report Authenticated By: Osa Craver, M.D.    Medications:    . sodium chloride   Intravenous Once  . sodium chloride   Intravenous Once  . acetaminophen  650 mg Oral Once  . ertapenem  1 g Intravenous Once  . ertapenem (INVANZ) IV  1 g Intravenous Q24H  . heparin  5,000 Units Subcutaneous Q8H  .  ketorolac  30 mg Intravenous Once  . morphine   Intravenous Q4H  . morphine      . ondansetron (ZOFRAN) IV  4 mg Intravenous Once  . sodium chloride  2,000 mL Intravenous Once    Assessment/Plan Acute appendictis, with Lap appendectomy, and drain placement 12/28/11 Dr. Biagio Quint BMI 38    Plan:  Mobilize, will discuss clamping NG, IS.  Follow labs,  we expect him to be sick post op with findings by DR. Layton. This is actually a Progress note.  I cannot  Change it as an addendum.  175 from NG after they worked on the NG    LOS: 1 day    Janazia Schreier 12/29/2011

## 2011-12-29 NOTE — Progress Notes (Signed)
The patient interviewed and examined. I agree with evaluation and treatment plan outlined by Mr. [name].  We will need to continue NG suction, expect ileus from his peritonitis for a few days. We will discontinue Foley catheter. Encouragements hysterometry. Encourage ambulation.  Steve Rogers. Derrell Lolling, M.D., [facility] General and Minimally invasive Surgery Breast and Colorectal Surgery Office:   [phone] Pager:   [phone]

## 2011-12-29 NOTE — Op Note (Signed)
NAME:  Steve Rogers, Steve Rogers NO.:  [id number]  MEDICAL RECORD NO.:  [id number]  LOCATION:  [facility]                         FACILITY:  [facility]  PHYSICIAN:  Lodema Pilot, MD       DATE OF BIRTH:  1965/10/30  DATE OF PROCEDURE:  12/28/2011 DATE OF DISCHARGE:  12/28/2011                              OPERATIVE REPORT   PROCEDURE:  Laparoscopic appendectomy with drainage of intra-abdominal abscess and drain placement.  SURGEON:  Lodema Pilot, MD  ASSISTANT:  None.  ANESTHESIA:  General endotracheal anesthesia with 30 mL of 1% lidocaine with epinephrine and 0.25% Marcaine in a 50:50 mixture.  FLUIDS:  2 L of crystalloid.  ESTIMATED BLOOD LOSS:  Minimal.  DRAINS:  A 19-French Blake drain placed in the abscess cavity.  SPECIMENS:  Appendix sent to pathology permanent section.  COMPLICATIONS:  None apparent.  FINDINGS:  Acute perforated appendicitis with abscess and diffuse peritonitis.  The 19-French Harrison Mons drain placed in the abscess cavity in the right lower quadrant.  INDICATIONS FOR PROCEDURE:  Mr. Fidel is a 46 year old male with 3-day history of increasing general abdominal pain, constipation, and fevers and chills.  He presented to [facility] and CT scan confirmed acute appendicitis.  OPERATIVE DETAILS:  Mr. Pagnotta was seen evaluated in the emergency room and risks and benefits of procedure were discussed in lay terms. Informed consent was obtained.  He was taken to the operating room, placed on table in supine position.  The therapeutic antibiotics were given and general endotracheal anesthesia was obtained.  Foley catheter was placed, and his left arm was tucked.  His abdomen was prepped and draped in a standard surgical fashion.  A supraumbilical midline incision was made in the skin and dissection carried down to the abdominal wall.  The abdominal wall fascia was elevated and sharply incised and peritoneum was entered with blunt dissection.  A 12  mm balloon port was placed into the peritoneum and pneumoperitoneum was obtained.  A laparoscope was introduced and there was no evidence of bowel injury upon entry.  He has distended and erythematous small bowel diffusely with inflammatory changes in the right lower quadrant with the omentum and small bowel stuck to the abdominal wall and purulent appearing material throughout.  A left lower quadrant 5 mm trocar was placed under direct visualization and a 5 mm right upper quadrant trocar was also placed under direct visualization.  I took down the adhesions and small bowel from the abdominal wall using blunt dissection, and as soon as I did this, I entered an abscess cavity, first an interloop abscess drained from small bowel and this was suctioned.  As I continued to roll the small bowel away from the pelvic side wall, I entered another abscess cavity in the area of the appendix, which was visualized and noted to be the source of the contamination.  The second abscess cavity in the area of the arch and just medial to the appendix with a perforation in the midportion of the appendix and feculent contamination and appendicolith or fecalith.  I was able to identify the base of the appendix and created a window through the mesoappendix.  Also before dividing anything I placed a 3rd 5 mm trocar in the right lower quadrant because I was having difficult following the appendix under the base due to the thickened pad in the area as well as the dilated small bowel.  I was able to use this as a new camera port looking down the on the area of the appendix and I was able to confirm that this was indeed the base of the appendix and I passed an Endo-GIA and divided the appendix at its base with a purple Tri-Staple load.  The staples appeared well formed and the staple line was hemostatic.  Then, I elevated the appendix and divided the mesoappendix along the wall of the appendix using the Harmonic  scalpel.  The appendix and the fecaliths were placed in an EndoCatch bag and removed from the umbilical trocar site and sent to pathology.  I replaced the trocar in the umbilicus and then broke up any other interloop fluid collections.  Then, irrigated the right lower quadrant with several liters of saline solution.  The irrigation returned clear.  Staple lines and mesoappendix appeared hemostatic.  I was not able to run the remainder of the small bowel because it was very thickened and erythematous and basically stuck amongst itself.  I placed a 19-French Blake drain into the abdomen and in the area of the abscess cavity at near the base of the appendix at the staple line and exited this through the right lower quadrant at the 5 mm trocar site.  It was sutured in place with a nylon drain stitch.  The right upper quadrant trocar was removed under direct visualization and the umbilical trocar was removed and the fascia was approximated with #0 Vicryl interrupted sutures.  The fascia was well approximated and I re-insufflated the abdomen through the left lower quadrant trocar site, and the abdomen was again noted be hemostatic and the abdominal fascial closure appeared to be adequate without any evidence of bowel injury.  The gas was removed and the final trocar was removed and the wounds were injected with 30 mL of 1% lidocaine with epinephrine and 0.25% Marcaine in a 50:50 mixture. Skin edges were approximated with #4-0 Monocryl subcuticular suture. Skin was washed and dried and benzoin and Steri-Strips were applied. All sponge, needle, and instrument counts correct in the case.  Sterile dressings were applied.  NG tube was placed and Foley catheter was left in place given his elevated creatinine, and dark urine, and need for ongoing fluid resuscitation.          ______________________________ Lodema Pilot, MD     BL/MEDQ  D:  12/28/2011  T:  12/29/2011  Job:  [id number]

## 2011-12-30 DIAGNOSIS — K3532 Acute appendicitis with perforation and localized peritonitis, without abscess: Secondary | ICD-10-CM | POA: Diagnosis present

## 2011-12-30 LAB — COMPREHENSIVE METABOLIC PANEL
Albumin: 2.2 g/dL — ABNORMAL LOW (ref 3.5–5.2)
Alkaline Phosphatase: 93 U/L (ref 39–117)
BUN: 29 mg/dL — ABNORMAL HIGH (ref 6–23)
Calcium: 8.3 mg/dL — ABNORMAL LOW (ref 8.4–10.5)
Creatinine, Ser: 1.09 mg/dL (ref 0.50–1.35)
GFR calc Af Amer: >90 mL/min (ref >90)
Glucose, Bld: 105 mg/dL — ABNORMAL HIGH (ref 70–99)
Potassium: 3.4 mEq/L — ABNORMAL LOW (ref 3.5–5.1)
Total Protein: 6.1 g/dL (ref 6.0–8.3)

## 2011-12-30 LAB — CBC
HCT: 39.5 % (ref 39.0–52.0)
Hemoglobin: 13.6 g/dL (ref 13.0–17.0)
MCH: 29.5 pg (ref 26.0–34.0)
MCHC: 34.4 g/dL (ref 30.0–36.0)
RDW: 13.3 % (ref 11.5–15.5)

## 2011-12-30 LAB — MAGNESIUM: Magnesium: 2.4 mg/dL (ref 1.5–2.5)

## 2011-12-30 MED ORDER — ACETAMINOPHEN 10 MG/ML IV SOLN
1000.0000 mg | Freq: Four times a day (QID) | INTRAVENOUS | Status: AC
  Administered 2011-12-30 – 2011-12-31 (×4): 1000 mg via INTRAVENOUS
  Filled 2011-12-30 (×4): qty 100

## 2011-12-30 MED ORDER — POTASSIUM CHLORIDE IN NACL 40-0.9 MEQ/L-% IV SOLN
INTRAVENOUS | Status: DC
  Administered 2011-12-30: 11:00:00 via INTRAVENOUS
  Administered 2011-12-30: 125 mL/h via INTRAVENOUS
  Administered 2011-12-31: 15:00:00 via INTRAVENOUS
  Administered 2011-12-31: 125 mL/h via INTRAVENOUS
  Administered 2012-01-01 – 2012-01-07 (×9): via INTRAVENOUS
  Filled 2011-12-30 (×18): qty 1000

## 2011-12-30 NOTE — Plan of Care (Signed)
Problem: Phase I Progression Outcomes Goal: Voiding-avoid urinary catheter unless indicated Outcome: Completed/Met Date Met:  12/30/11 Voiding in urinal

## 2011-12-30 NOTE — Plan of Care (Signed)
Problem: Phase III Progression Outcomes Goal: Pain controlled on oral analgesia Outcome: Completed/Met Date Met:  12/30/11 Pt is not using PCA and not requesting other pain medications

## 2011-12-30 NOTE — Progress Notes (Signed)
General surgery attending note:  Patient interviewed and examined. Agree with the assessment and treatment plan as outlined by Mr. [name].  He is stable but still has a distended abdomen. Minimal bowel sounds. Abdomen is pretty soft and nontender he says he is passing flatus and stool. I do not think that his ileus has resolved yet.  We will continue antibiotics, clamp the NG and see if he tolerates clear liquids.  Angelia Mould. Derrell Lolling, M.D., [facility] General and Minimally invasive Surgery Breast and Colorectal Surgery Office:   [phone] Pager:   [phone]

## 2011-12-30 NOTE — Progress Notes (Signed)
2 Days Post-Op  Subjective: Feels a little better, still pretty distended.  Not much thru NG, passing flatus and had BM yesterday.  Objective: Vital signs in last 24 hours: Temp:  [97.6 F (36.4 C)-98.6 F (37 C)] 98.6 F (37 C) (08/06 0542) Pulse Rate:  [82-96] 82  (08/06 0542) Resp:  [14-18] 16  (08/06 0542) BP: (120-152)/(55-86) 145/77 mmHg (08/06 0542) SpO2:  [95 %-99 %] 99 % (08/06 0542) Last BM Date: 12/29/11  250 from NG yest, and from this last shift.  115 ml thru drain, 2 BM's reported yesterday. NPO, afebrile, BP up some,  K+ 3.4 Creatinine is back to normal, WBC is back to normal.  Intake/Output from previous day: 08/05 0701 - 08/06 0700 In: 3337 [I.V.:3285; NG/GT:50] Out: 1467 [Urine:1100; Emesis/NG output:250; Drains:115; Stool:2] Intake/Output this shift: Total I/O In: 50 [NG/GT:50] Out: 300 [Emesis/NG output:300]  General appearance: alert, cooperative, no distress and uncomfortable, abd still fairly distended. Resp: clear to auscultation bilaterally GI: distended, but a little softer than yesterday. +BS, +flatus, +BM, incisions look good. Drainage from JP is clear serous.  Lab Results:   Basename 12/30/11 0427 12/29/11 0432  WBC 9.5 14.6*  HGB 13.6 14.2  HCT 39.5 41.6  PLT 346 322    BMET  Basename 12/30/11 0427 12/29/11 0432  NA 138 135  K 3.4* 4.3  CL 103 99  CO2 26 28  GLUCOSE 105* 129*  BUN 29* 40*  CREATININE 1.09 1.40*  CALCIUM 8.3* 8.8   PT/INR No results found for this basename: LABPROT:2,INR:2 in the last 72 hours   Lab 12/30/11 0427 12/29/11 0432 12/28/11 1229  AST 49* 13 14  ALT 47 18 26  ALKPHOS 93 60 72  BILITOT 0.8 0.8 1.6*  PROT 6.1 6.1 8.3  ALBUMIN 2.2* 2.4* 3.5     Lipase     Component Value Date/Time   LIPASE 41 12/28/2011 1229     Studies/Results: Ct Abdomen Pelvis Wo Contrast  12/28/2011  *RADIOLOGY REPORT*  Clinical Data: Abdominal pain, nausea, elevated creatinine  CT ABDOMEN AND PELVIS WITHOUT CONTRAST   Technique:  Multidetector CT imaging of the abdomen and pelvis was performed following the standard protocol without intravenous contrast.  Comparison: None.  Findings: Scarring or subsegmental atelectasis posteriorly in the visualized lung bases.  Unremarkable uninfused evaluation of liver, gallbladder, adrenal glands, pancreas.  There is high attenuation material in the lumen of the gallbladder.  Left nephrolithiasis, 6 mm calculus in the lower pole renal collecting system.  2 cm fluid attenuation lesion in the interpolar region left kidney. No hydronephrosis.  Ureters decompressed without calculus.  Stomach is nondistended.  There are multiple mid abdominal distended small bowel loops and a single dilated loop of small bowel in the mid abdomen measuring 5 cm transverse diameter.  Loops of more distal ileum are surrounded by mild inflammatory/edematous changes.  The appendix is dilated up to 18 mm diameter, with mild wall thickening, containing gas and appendicoliths with adjacent inflammatory/edematous change.  No definite extraluminal fluid collections.  The colon is decompressed.  Urinary bladder incompletely distended.  No free air.  No ascites.  IMPRESSION:  1.  Distended appendix with appendicoliths, wall thickening, and adjacent inflammatory/inflammatory changes suggesting acute appendicitis.  No evidence of perforation or abscess. 2.  Left nephrolithiasis without hydronephrosis.  Original Report Authenticated By: Osa Craver, M.D.    Medications:    . ertapenem (INVANZ) IV  1 g Intravenous Q24H  . famotidine (PEPCID) IV  20 mg Intravenous Q24H  . heparin  5,000 Units Subcutaneous Q8H  . morphine   Intravenous Q4H    Assessment/Plan Acute appendictis, with Lap appendectomy, and drain placement 12/28/11 Dr. Biagio Quint  BMI 38  Left Nephrolithiasis without hydronephrosis   Plan: Mobilize, try some clamping trials with NG. She how he does with sips of clears. Continue antibiotics, Replace  K+     LOS: 2 days    Steve Rogers 12/30/2011

## 2011-12-31 ENCOUNTER — Inpatient Hospital Stay (HOSPITAL_COMMUNITY): Payer: MEDICAID

## 2011-12-31 ENCOUNTER — Encounter (HOSPITAL_COMMUNITY): Payer: Self-pay | Admitting: General Surgery

## 2011-12-31 DIAGNOSIS — K567 Ileus, unspecified: Secondary | ICD-10-CM

## 2011-12-31 DIAGNOSIS — N200 Calculus of kidney: Secondary | ICD-10-CM

## 2011-12-31 DIAGNOSIS — Z6838 Body mass index (BMI) 38.0-38.9, adult: Secondary | ICD-10-CM

## 2011-12-31 DIAGNOSIS — K9189 Other postprocedural complications and disorders of digestive system: Secondary | ICD-10-CM | POA: Clinically undetermined

## 2011-12-31 HISTORY — DX: Body mass index (BMI) 38.0-38.9, adult: Z68.38

## 2011-12-31 HISTORY — DX: Calculus of kidney: N20.0

## 2011-12-31 HISTORY — DX: Ileus, unspecified: K56.7

## 2011-12-31 HISTORY — DX: Other postprocedural complications and disorders of digestive system: K91.89

## 2011-12-31 LAB — BASIC METABOLIC PANEL
BUN: 24 mg/dL — ABNORMAL HIGH (ref 6–23)
CO2: 24 mEq/L (ref 19–32)
Chloride: 101 mEq/L (ref 96–112)
GFR calc non Af Amer: >90 mL/min (ref >90)
Glucose, Bld: 96 mg/dL (ref 70–99)
Potassium: 4 mEq/L (ref 3.5–5.1)
Sodium: 138 mEq/L (ref 135–145)

## 2011-12-31 LAB — CBC
HCT: 40.2 % (ref 39.0–52.0)
Hemoglobin: 14.1 g/dL (ref 13.0–17.0)
MCHC: 35.1 g/dL (ref 30.0–36.0)
RBC: 4.7 MIL/uL (ref 4.22–5.81)
WBC: 12.1 10*3/uL — ABNORMAL HIGH (ref 4.0–10.5)

## 2011-12-31 MED ORDER — DIPHENHYDRAMINE HCL 12.5 MG/5ML PO ELIX: 12.5000 mg | ORAL_SOLUTION | Freq: Four times a day (QID) | ORAL | Status: DC | PRN

## 2011-12-31 MED ORDER — NALOXONE HCL 0.4 MG/ML IJ SOLN: 0.4000 mg | INTRAMUSCULAR | Status: DC | PRN

## 2011-12-31 MED ORDER — SODIUM CHLORIDE 0.9 % IJ SOLN: 9.0000 mL | INTRAMUSCULAR | Status: DC | PRN

## 2011-12-31 MED ORDER — DIPHENHYDRAMINE HCL 50 MG/ML IJ SOLN: 12.5000 mg | Freq: Four times a day (QID) | INTRAMUSCULAR | Status: DC | PRN

## 2011-12-31 MED ORDER — MORPHINE SULFATE (PF) 1 MG/ML IV SOLN: INTRAVENOUS | Status: DC

## 2011-12-31 MED ORDER — METOPROLOL TARTRATE 1 MG/ML IV SOLN
5.0000 mg | Freq: Four times a day (QID) | INTRAVENOUS | Status: DC | PRN
  Administered 2011-12-31 (×2): 5 mg via INTRAVENOUS
  Filled 2011-12-31 (×2): qty 5

## 2011-12-31 MED ORDER — ONDANSETRON HCL 4 MG/2ML IJ SOLN: 4.0000 mg | Freq: Four times a day (QID) | INTRAMUSCULAR | Status: DC | PRN

## 2011-12-31 MED ORDER — ACETAMINOPHEN 10 MG/ML IV SOLN
1000.0000 mg | Freq: Four times a day (QID) | INTRAVENOUS | Status: AC
  Administered 2011-12-31 – 2012-01-01 (×3): 1000 mg via INTRAVENOUS
  Filled 2011-12-31 (×7): qty 100

## 2011-12-31 NOTE — Progress Notes (Signed)
12/31/11 1000  PT Visit Information  Last PT Received On 12/31/11  Reason Eval/Treat Not Completed Other (comment) (Pt amb I in hallway; no needs)   Pt will sign off.

## 2011-12-31 NOTE — Progress Notes (Signed)
3 Days Post-Op  Subjective: He looks and feels better this Am.  Passing more flatus, and another BM last Pm.  He says he was taking allot in and they cut him back last PM, so most of NG drainage was most likely the PO he was taking in.  Objective: Vital signs in last 24 hours: Temp:  [98.6 F (37 C)-99.1 F (37.3 C)] 98.6 F (37 C) (08/07 0444) Pulse Rate:  [89-92] 92  (08/07 0444) Resp:  [16-20] 18  (08/07 0444) BP: (166-179)/(88-98) 169/93 mmHg (08/07 0444) SpO2:  [95 %-98 %] 95 % (08/07 0444) Last BM Date: 12/31/11  2150 ml/NG recorded yestereday, 1 stool, nothing PO recorded, Diet: sips of clears, afebrile BP is up, WBC is up, creatinine is normal now  Intake/Output from previous day: 08/06 0701 - 08/07 0700 In: 1275.7 [I.V.:1165.7; NG/GT:50] Out: 3460 [Urine:1250; Emesis/NG output:2150; Drains:60] Intake/Output this shift:    General appearance: alert, cooperative and no distress Resp: clear to auscultation bilaterally GI: softer and less distended today, few bowel sounds, wounds look good.  More flatus, incisions look good. Drainage from JP is clear and serous today. Lab Results:   Basename 12/31/11 0432 12/30/11 0427  WBC 12.1* 9.5  HGB 14.1 13.6  HCT 40.2 39.5  PLT 367 346    BMET  Basename 12/31/11 0432 12/30/11 0427  NA 138 138  K 4.0 3.4*  CL 101 103  CO2 24 26  GLUCOSE 96 105*  BUN 24* 29*  CREATININE 0.95 1.09  CALCIUM 8.5 8.3*   PT/INR No results found for this basename: LABPROT:2,INR:2 in the last 72 hours   Lab 12/30/11 0427 12/29/11 0432 12/28/11 1229  AST 49* 13 14  ALT 47 18 26  ALKPHOS 93 60 72  BILITOT 0.8 0.8 1.6*  PROT 6.1 6.1 8.3  ALBUMIN 2.2* 2.4* 3.5     Lipase     Component Value Date/Time   LIPASE 41 12/28/2011 1229     Studies/Results: No results found.  Medications:    . acetaminophen  1,000 mg Intravenous Q6H  . ertapenem (INVANZ) IV  1 g Intravenous Q24H  . famotidine (PEPCID) IV  20 mg Intravenous Q24H  .  heparin  5,000 Units Subcutaneous Q8H  . morphine   Intravenous Q4H    Assessment/Plan Acute appendictis, with Lap appendectomy, and drain placement 12/28/11 Dr. Biagio Quint  WBC is up today Post op ileus Mild renal insuffiencey, resolved with hydration  BMI 38  Left Nephrolithiasis without hydronephrosis   Plan:  Resume NG clamping trials, continue to mobilize.  Decrease IV rate, watch WBC. If he does well with clamping trials try clears again later today.  LOS: 3 days    Maddelyn Rocca 12/31/2011

## 2011-12-31 NOTE — Progress Notes (Signed)
BP remains up  163/93, 190/100,  176/108 since 4 AM. Creatinine is back to normal, no evaluation by MD since he left [employer] 13 years ago.  HR is also up, I will start a low dose BB now.  Follow BMP and CBC in AM. CMP     Component Value Date/Time   NA 138 12/31/2011 0432   K 4.0 12/31/2011 0432   CL 101 12/31/2011 0432   CO2 24 12/31/2011 0432   GLUCOSE 96 12/31/2011 0432   BUN 24* 12/31/2011 0432   CREATININE 0.95 12/31/2011 0432   CALCIUM 8.5 12/31/2011 0432   PROT 6.1 12/30/2011 0427   ALBUMIN 2.2* 12/30/2011 0427   AST 49* 12/30/2011 0427   ALT 47 12/30/2011 0427   ALKPHOS 93 12/30/2011 0427   BILITOT 0.8 12/30/2011 0427   GFRNONAA >90 12/31/2011 0432   GFRAA >90 12/31/2011 0432   CBC    Component Value Date/Time   WBC 12.1* 12/31/2011 0432   RBC 4.70 12/31/2011 0432   HGB 14.1 12/31/2011 0432   HCT 40.2 12/31/2011 0432   PLT 367 12/31/2011 0432   MCV 85.5 12/31/2011 0432   MCH 30.0 12/31/2011 0432   MCHC 35.1 12/31/2011 0432   RDW 13.2 12/31/2011 0432   LYMPHSABS 3.3 12/28/2011 1229   MONOABS 0.9 12/28/2011 1229   EOSABS 0.0 12/28/2011 1229   BASOSABS 0.0 12/28/2011 1229

## 2011-12-31 NOTE — Progress Notes (Signed)
bp 190/100 manually. Pulse 120. Patient denies hx hypertension. Will [name] PA respond to page via telephone & relayed to watch & se how much patient urinates today. Will monitor bp.

## 2011-12-31 NOTE — Progress Notes (Signed)
General surgery attending note:  Felt a little bloated after 3 hours, and NG tube put back on suction. He has had 3 loose stools. Abdominal exam reveals abdominal distention but not much tenderness. JP drainage serosanguineous.  Will send stool for C. Difficile. We'll get abdominal x-rays to see what the pattern of his gas distribution is. Continue NG tube.   Angelia Mould. Derrell Lolling, M.D., [facility] General and Minimally invasive Surgery Breast and Colorectal Surgery Office:   [phone] Pager:   [phone]

## 2012-01-01 ENCOUNTER — Encounter (HOSPITAL_COMMUNITY): Payer: Self-pay | Admitting: Family Medicine

## 2012-01-01 DIAGNOSIS — R7309 Other abnormal glucose: Secondary | ICD-10-CM

## 2012-01-01 DIAGNOSIS — Z6838 Body mass index (BMI) 38.0-38.9, adult: Secondary | ICD-10-CM

## 2012-01-01 DIAGNOSIS — R7303 Prediabetes: Secondary | ICD-10-CM | POA: Diagnosis present

## 2012-01-01 DIAGNOSIS — I10 Essential (primary) hypertension: Secondary | ICD-10-CM | POA: Diagnosis present

## 2012-01-01 DIAGNOSIS — K3580 Unspecified acute appendicitis: Secondary | ICD-10-CM

## 2012-01-01 LAB — CBC
Hemoglobin: 13.7 g/dL (ref 13.0–17.0)
MCH: 29 pg (ref 26.0–34.0)
RBC: 4.73 MIL/uL (ref 4.22–5.81)
WBC: 13.5 10*3/uL — ABNORMAL HIGH (ref 4.0–10.5)

## 2012-01-01 LAB — BASIC METABOLIC PANEL
CO2: 25 mEq/L (ref 19–32)
Chloride: 102 mEq/L (ref 96–112)
Glucose, Bld: 95 mg/dL (ref 70–99)
Potassium: 4.2 mEq/L (ref 3.5–5.1)
Sodium: 139 mEq/L (ref 135–145)

## 2012-01-01 MED ORDER — METOPROLOL TARTRATE 1 MG/ML IV SOLN
2.5000 mg | Freq: Four times a day (QID) | INTRAVENOUS | Status: DC | PRN
  Administered 2012-01-01: 5 mg via INTRAVENOUS
  Filled 2012-01-01: qty 5

## 2012-01-01 MED ORDER — HYDRALAZINE HCL 20 MG/ML IJ SOLN
10.0000 mg | Freq: Four times a day (QID) | INTRAMUSCULAR | Status: DC | PRN
  Administered 2012-01-01 – 2012-01-02 (×2): 10 mg via INTRAVENOUS
  Filled 2012-01-01 (×2): qty 1

## 2012-01-01 MED ORDER — ACETAMINOPHEN 10 MG/ML IV SOLN
1000.0000 mg | Freq: Four times a day (QID) | INTRAVENOUS | Status: AC
  Administered 2012-01-01 – 2012-01-02 (×4): 1000 mg via INTRAVENOUS
  Filled 2012-01-01 (×3): qty 100

## 2012-01-01 MED ORDER — AMLODIPINE BESYLATE 10 MG PO TABS
10.0000 mg | ORAL_TABLET | Freq: Every day | ORAL | Status: DC
  Administered 2012-01-01 – 2012-01-07 (×7): 10 mg via ORAL
  Filled 2012-01-01 (×7): qty 1

## 2012-01-01 MED ORDER — MORPHINE SULFATE 2 MG/ML IJ SOLN: 1.0000 mg | INTRAMUSCULAR | Status: DC | PRN

## 2012-01-01 NOTE — Progress Notes (Signed)
General surgery attending note:.  I have interviewed and examined this patient. He seems to feel better without the NG tube and is having loose stools and denies nausea. C. Difficile PCR is negative. Abdominal x-rays suggest ileus.  Abdomen is soft, somewhat distended, wounds are okay.  We will leave the NG tube out for now, I have written to decrease his narcotics, allow clear liquids.  Medicine to see regarding hypertension.   Angelia Mould. Derrell Lolling, M.D., [facility] General and Minimally invasive Surgery Breast and Colorectal Surgery Office:   [phone] Pager:   [phone]

## 2012-01-01 NOTE — Progress Notes (Signed)
WENT INTO PT'S ROOM A FEW MINUTES AGO AND HIS NG TUBE WAS OUT.  HE SAID IT HAD COME OUT ABOUT 20 MINUTES BEFORE I CAME IN.  HE DIDN'T CALL TO LET ME KNOW.  HE SAID HE DIDN'T THINK IT NEEDED TO BE PUT BACK IN BECAUSE HE HAS BEEN HAVING LOOSE STOOLS VERY FREQUENTLY THROUGHOUT THE NIGHT.  HE HAS HAD ABOUT 200 CC OF BROWN DRAINAGE SINCE 7 PM.  I CALLED PHYSICIAN ON CALL, OBTAINED ORDER TO LEAVE IT FOR  NOW.

## 2012-01-01 NOTE — Consult Note (Signed)
[facility] Medical Consultation  Brannon Levene KGM:[id number] DOB: 1966-03-14 DOA: 12/28/2011 PCP: No primary provider on file.   Requesting physician: Phillips Grout Date of consultation: 01/01/12 Reason for consultation: Gen medical care-HTN  Impression/Recommendations Principal Problem:  *Acute fulminating appendicitis with perforation and peritonitis Active Problems:  Ileus following gastrointestinal surgery  Nephrolithiasis  BMI 38.0-38.9,adult  HTN (hypertension)  Pre-diabetes  Morbid obesity with BMI of 40.0-44.9, adult    1. Hypertension, no evidence of urgency/emergency-would discontinue use of IV metoprolol given relative hypotension this morning.  Will start amlodipine 5 mg, which may be up titrated to 10 mg daily.  If needed would start low-dose thiazide subsequent to this.  Amlodipine is effect probably not be seen until 01/03/2012, would place on IV hydralazine until then (already done and ordered)-Given has ileus, there is theoretical potential for CCB to slow gut.  2. Diarrhea, potentially ileus-C. difficile 12/31/2011 negative.  Acute abdominal series 12/31/2011 shows multiple distended loops-her CCS if decision to order CT scan. Have advised patient to increase use of yogurt for probiotic effect-would try to limit carboplatin use as much as possible-this would be contingent upon surgeon-patient has no fever or chills and may benefit from being discontinued from the same. 3. Nephrolithiasis on CT scan 12/28/2011-: Stent to finding-would monitor and follow closely 4. BMI 38-38.9-will need outpatient therapy and counseling.  Dietician referral when eating per CCS discretion 5. Pre-DM?-Will obtain A1c in am given habitus 6. Acute kidney injury secondary to sepsis/dehydration-improving, agree with continuation of normal saline + potassium supplementation for now until full diet  I will followup again tomorrow. Please contact me if I can be of assistance in the meanwhile. Thank  you for this consultation.  Chief Complaint: Acute fulminant appendicitis  HPI:  46 [facility] 0.8 413 with nausea vomiting and ultimately found to have an appendicitis and seemed to be in overt sepsis-underwent laparoscopic appendectomy 12/28/2011, noted initially to be in acute kidney failure likely secondary to volume depletion with BUN and 40/creatinine 1.4, and leukocytosis of 14.6. Patient has no specific complaints today other than some mild abdominal pain and continued diarrhea and a cough that developed overnight but she thinks may have happened after the NG tube came out  Review of Systems:  No headache, no blurred  Vision, no double vision, +cough since this am with food, no consitaption, +diarrhoea--gong to the RR every 1-2 hours., seems aqua coloured and now is a dark seaweed green (Cdiff neg 8/7),  no obstipation, appetite has improved over 2 days.  +flatus, no fever/chill/cp/abdominla pain 2/10 currently-goes upto 2 with activity, no faint or dizzy feeling  Past Medical History  Diagnosis Date  . Ileus following gastrointestinal surgery 12/31/2011  . Nephrolithiasis 12/31/2011  . BMI 38.0-38.9,adult 12/31/2011  . HTN (hypertension)   . Pre-diabetes   . Morbid obesity with BMI of 40.0-44.9, adult    Past Surgical History  Procedure Date  . Laparoscopic appendectomy 12/28/2011    Procedure: APPENDECTOMY LAPAROSCOPIC;  Surgeon: Lodema Pilot, DO;  Location: [facility];  Service: General;  Laterality: N/A;  laparoscopic appendectomy and drainage of intra abdominal abscess   Social History:  reports that he has never smoked. He does not have any smokeless tobacco history on file. He reports that he does not drink alcohol. His drug history not on file.  No Known Allergies History reviewed. No pertinent family history.  Prior to Admission medications   Medication Sig Start Date End Date Taking? Authorizing Provider  Psyllium (  METAMUCIL PO) Take 2  capsules by mouth daily as needed. For digestive health.   Yes Historical Provider, MD   Physical Exam: Blood pressure 177/101, pulse 102, temperature 97.9 F (36.6 C), temperature source Oral, resp. rate 17, height 5\' 11"  (1.803 m), weight 125.646 kg (277 lb), SpO2 98.00%. Filed Vitals:   01/01/12 0215 01/01/12 0632 01/01/12 0800 01/01/12 1000  BP: 157/98 98/62  177/101  Pulse: 97 99  102  Temp: 98.4 F (36.9 C) 97.8 F (36.6 C)  97.9 F (36.6 C)  TempSrc: Oral Oral  Oral  Resp: 18 18 18 17   Height:      Weight:      SpO2: 98% 96% 97% 98%     General:  Obese pleasant Caucasian male in no apparent distress  Eyes: No pallor no icterus  ENT: Soft supple, no erythema to throat  Neck: Soft supple no thyromegaly no JVD no carotid bruit  Cardiovascular: S1-S2 no murmur rub or gallop regular rate rhythm non-telemetry  Respiratory: Clear no added sound no tactile vocal resonance or fremitus  Abdomen: Distended.  No rebound guarding.  Bowel sounds diminished.  Drain noted in the right lower quadrant with minimal amount in back.  Well-healed laparoscopic scars  Skin: See above-no other findings  Musculoskeletal: Moving all 4 limbs equally  Psychiatric: Euthymic  Neurologic: Motor grossly intact, reflexes 5/5, sensation grossly intact, no cerebellar signs-gait not assessed  Labs on Admission:  Basic Metabolic Panel:  Lab 01/01/12 1610 12/31/11 0432 12/30/11 0427 12/29/11 0432 12/28/11 1229  NA 139 138 138 135 132*  K 4.2 4.0 3.4* 4.3 3.5  CL 102 101 103 99 89*  CO2 25 24 26 28 28   GLUCOSE 95 96 105* 129* 182*  BUN 20 24* 29* 40* 46*  CREATININE 0.99 0.95 1.09 1.40* 1.90*  CALCIUM 8.8 8.5 8.3* 8.8 10.2  MG -- -- 2.4 -- --  PHOS -- -- -- -- --   Liver Function Tests:  Lab 12/30/11 0427 12/29/11 0432 12/28/11 1229  AST 49* 13 14  ALT 47 18 26  ALKPHOS 93 60 72  BILITOT 0.8 0.8 1.6*  PROT 6.1 6.1 8.3  ALBUMIN 2.2* 2.4* 3.5    Lab 12/28/11 1229  LIPASE 41    AMYLASE --   No results found for this basename: AMMONIA:5 in the last 168 hours CBC:  Lab 01/01/12 0437 12/31/11 0432 12/30/11 0427 12/29/11 0432 12/28/11 1229  WBC 13.5* 12.1* 9.5 14.6* 21.9*  NEUTROABS -- -- -- -- 17.7*  HGB 13.7 14.1 13.6 14.2 17.7*  HCT 40.9 40.2 39.5 41.6 48.5  MCV 86.5 85.5 85.7 86.1 82.3  PLT 409* 367 346 322 410*   Cardiac Enzymes: No results found for this basename: CKTOTAL:5,CKMB:5,CKMBINDEX:5,TROPONINI:5 in the last 168 hours BNP: No components found with this basename: POCBNP:5 CBG: No results found for this basename: GLUCAP:5 in the last 168 hours  Radiological Exams on Admission: Dg Abd 2 Views  12/31/2011  *RADIOLOGY REPORT*  Clinical Data: Abdominal distention.  Ruptured appendicitis.  ABDOMEN - 2 VIEW  Comparison: CT scan dated 12/28/2011  Findings: NG tube tip is barely in the fundus of the stomach. There are multiple slightly distended loops of small bowel.  The colon is not distended.  Drain is seen in the right lower quadrant.  IMPRESSION: Multiple distended loops small bowel, essentially unchanged since the preoperative exam.  Drain and NG tube in place as described.  Original Report Authenticated By: Gwynn Burly, M.D.  Time spent: 50 minutes  Mahala Menghini [facility] Pager [phone]  If 7PM-7AM, please contact night-coverage [email or web address] Password [email or web address] 01/01/2012, 11:56 AM

## 2012-01-01 NOTE — Progress Notes (Signed)
4 Days Post-Op  Subjective: Up in chair again, still having loose stools things he's having more thru that than urine output.  Pain is fairly well controlled, he's walking without to much difficulty, he says it doesn't hurt to much to walk.  Some flatus. He notes he was sick at home for 3-4 days before he came to ER. Objective: Vital signs in last 24 hours: Temp:  [97.8 F (36.6 C)-100.1 F (37.8 C)] 97.8 F (36.6 C) (08/08 1027) Pulse Rate:  [96-120] 99  (08/08 0632) Resp:  [18] 18  (08/08 0800) BP: (98-190)/(62-108) 98/62 mmHg (08/08 0632) SpO2:  [94 %-100 %] 97 % (08/08 0800) Last BM Date: 01/01/12  NG came out last PM, nothing from drain, or stool recorded. Diet: ice chips,  BP went from 190/100 down to 98/62 at 6AM. After 2 doses of lopressor 1700 hrs, and 2300 hrs. Yesterday. I had them check it and repeat about 9:10 was 177/101.   BMP is normal but WBC still up. Film yesterday shows ongoing SB distension, unchanged from Preop. C. Diff was negative yesterday. TM 100.1 yesterday at 9AM, afebrile this AM.  Intake/Output from previous day: 01/14/23 0701 - 08/08 0700 In: 4325.8 [I.V.:4025.8; IV Piggyback:300] Out: 2150 [Urine:1300; Emesis/NG output:850] Intake/Output this shift:    General appearance: alert, cooperative and no distress Resp: clear to auscultation bilaterally GI: softer, not really tender to palpation, incisions look good.  Drainage from JP is still clear serous.  Lab Results:   Basename 01/01/12 0437 01-14-2012 0432  WBC 13.5* 12.1*  HGB 13.7 14.1  HCT 40.9 40.2  PLT 409* 367    BMET  Basename 01/01/12 0437 01/14/2012 0432  NA 139 138  K 4.2 4.0  CL 102 101  CO2 25 24  GLUCOSE 95 96  BUN 20 24*  CREATININE 0.99 0.95  CALCIUM 8.8 8.5   PT/INR No results found for this basename: LABPROT:2,INR:2 in the last 72 hours   Lab 12/30/11 0427 12/29/11 0432 12/28/11 1229  AST 49* 13 14  ALT 47 18 26  ALKPHOS 93 60 72  BILITOT 0.8 0.8 1.6*  PROT 6.1 6.1 8.3    ALBUMIN 2.2* 2.4* 3.5     Lipase     Component Value Date/Time   LIPASE 41 12/28/2011 1229     Studies/Results: Dg Abd 2 Views  Jan 14, 2012  *RADIOLOGY REPORT*  Clinical Data: Abdominal distention.  Ruptured appendicitis.  ABDOMEN - 2 VIEW  Comparison: CT scan dated 12/28/2011  Findings: NG tube tip is barely in the fundus of the stomach. There are multiple slightly distended loops of small bowel.  The colon is not distended.  Drain is seen in the right lower quadrant.  IMPRESSION: Multiple distended loops small bowel, essentially unchanged since the preoperative exam.  Drain and NG tube in place as described.  Original Report Authenticated By: Gwynn Burly, M.D.    Medications:    . acetaminophen  1,000 mg Intravenous Q6H  . ertapenem (INVANZ) IV  1 g Intravenous Q24H  . famotidine (PEPCID) IV  20 mg Intravenous Q24H  . heparin  5,000 Units Subcutaneous Q8H  . morphine   Intravenous Q4H  . DISCONTD: morphine   Intravenous Q4H    Assessment/Plan Acute appendictis, with Lap appendectomy, and drain placement 12/28/11 Dr. Biagio Quint  WBC is up today  Post op ileus  Mild renal insuffiencey, resolved with hydration  BMI 38  Left Nephrolithiasis without hydronephrosis Worsening post op hypertension, no prior medical evaluations since discharge  from the [employer].   Plan:  I have ask Medicine to see today and help [place] with medical management.  He has had none for 13 years. Try some ice chips, and sips of clears again, see how he does, continue IV tylenol, non narcotic pain control, repeat labs AM, if WBC continues to rise we may need to repeat Ct tomorrow.    LOS: 4 days    Tanyla Stege 01/01/2012

## 2012-01-01 NOTE — Progress Notes (Signed)
Assessment and treatment plan by Mr. [name] is noted. Agree with his assessment. We'll followup on August 8.  Angelia Mould. Derrell Lolling, M.D., [facility] General and Minimally invasive Surgery Breast and Colorectal Surgery Office:   [phone] Pager:   [phone]

## 2012-01-02 ENCOUNTER — Encounter (HOSPITAL_COMMUNITY): Payer: Self-pay | Admitting: General Surgery

## 2012-01-02 DIAGNOSIS — N289 Disorder of kidney and ureter, unspecified: Secondary | ICD-10-CM

## 2012-01-02 DIAGNOSIS — N200 Calculus of kidney: Secondary | ICD-10-CM

## 2012-01-02 HISTORY — DX: Disorder of kidney and ureter, unspecified: N28.9

## 2012-01-02 LAB — COMPREHENSIVE METABOLIC PANEL
ALT: 55 U/L — ABNORMAL HIGH (ref 0–53)
Alkaline Phosphatase: 149 U/L — ABNORMAL HIGH (ref 39–117)
BUN: 15 mg/dL (ref 6–23)
Chloride: 102 mEq/L (ref 96–112)
GFR calc Af Amer: >90 mL/min (ref >90)
Glucose, Bld: 93 mg/dL (ref 70–99)
Potassium: 3.9 mEq/L (ref 3.5–5.1)
Sodium: 138 mEq/L (ref 135–145)
Total Bilirubin: 0.6 mg/dL (ref 0.3–1.2)

## 2012-01-02 LAB — CBC
HCT: 39 % (ref 39.0–52.0)
Hemoglobin: 13.4 g/dL (ref 13.0–17.0)
MCH: 29.5 pg (ref 26.0–34.0)
MCV: 85.7 fL (ref 78.0–100.0)
RBC: 4.55 MIL/uL (ref 4.22–5.81)
WBC: 13.7 10*3/uL — ABNORMAL HIGH (ref 4.0–10.5)

## 2012-01-02 MED ORDER — RISAQUAD PO CAPS
2.0000 | ORAL_CAPSULE | Freq: Every day | ORAL | Status: DC
  Administered 2012-01-02 – 2012-01-07 (×6): 2 via ORAL
  Filled 2012-01-02 (×6): qty 2

## 2012-01-02 NOTE — Progress Notes (Signed)
Patient interviewed and examined. I agree with the assessment and treatment plan outlined by Mr. Steve Rogers.  His ileus is slowly getting better. He is feeling better. He still has a low-grade white count. He still has diarrhea, C. Difficile negative.  We'll start him on probiotics and discontinue the JP drain.  Continue IV antibodies due to the degree of peritonitis that he had.   Angelia Mould. Derrell Lolling, M.D., [facility] General and Minimally invasive Surgery Breast and Colorectal Surgery Office:   [phone] Pager:   [phone]

## 2012-01-02 NOTE — Progress Notes (Addendum)
5 Days Post-Op  Subjective: He's feeling much better, stools are starting to turn more yellow-brown instead of green.  He's doing well with his clear liquids, no nausea, and walking.  Objective: Vital signs in last 24 hours: Temp:  [97.9 F (36.6 C)-99.6 F (37.6 C)] 99.6 F (37.6 C) (08/09 0600) Pulse Rate:  [102-107] 103  (08/09 0600) Resp:  [17-19] 18  (08/09 0600) BP: (148-187)/(60-101) 159/94 mmHg (08/09 0600) SpO2:  [96 %-99 %] 97 % (08/09 0600) Last BM Date: 01/01/12  3 BM recorded yesterday, 40 ml thru the drain, Diet Clears, BP is still up but better, Afebrile, still tachycardic also CMP is normal, but wbc remains elevated, no real change. Intake/Output from previous day: 08/08 0701 - 08/09 0700 In: 2853.3 [P.O.:160; I.V.:2393.3; IV Piggyback:300] Out: 40 [Drains:40] Intake/Output this shift:    General appearance: alert, cooperative and no distress Resp: clear to auscultation bilaterally GI: softer, less distended, incisions look good, +BS,+BM  Lab Results:   [facility] 01/02/12 0455 01/01/12 0437  WBC 13.7* 13.5*  HGB 13.4 13.7  HCT 39.0 40.9  PLT 412* 409*    BMET  Basename 01/02/12 0455 01/01/12 0437  NA 138 139  K 3.9 4.2  CL 102 102  CO2 25 25  GLUCOSE 93 95  BUN 15 20  CREATININE 0.93 0.99  CALCIUM 8.4 8.8   PT/INR No results found for this basename: LABPROT:2,INR:2 in the last 72 hours   Lab 01/02/12 0455 12/30/11 0427 12/29/11 0432 12/28/11 1229  AST 44* 49* 13 14  ALT 55* 47 18 26  ALKPHOS 149* 93 60 72  BILITOT 0.6 0.8 0.8 1.6*  PROT 5.9* 6.1 6.1 8.3  ALBUMIN 2.3* 2.2* 2.4* 3.5     Lipase     Component Value Date/Time   LIPASE 41 12/28/2011 1229     Studies/Results: Dg Abd 2 Views  01-12-2012  *RADIOLOGY REPORT*  Clinical Data: Abdominal distention.  Ruptured appendicitis.  ABDOMEN - 2 VIEW  Comparison: CT scan dated 12/28/2011  Findings: NG tube tip is barely in the fundus of the stomach. There are multiple slightly distended  loops of small bowel.  The colon is not distended.  Drain is seen in the right lower quadrant.  IMPRESSION: Multiple distended loops small bowel, essentially unchanged since the preoperative exam.  Drain and NG tube in place as described.  Original Report Authenticated By: Gwynn Burly, M.D.    Medications:    . acetaminophen  1,000 mg Intravenous Q6H  . acetaminophen  1,000 mg Intravenous Q6H  . amLODipine  10 mg Oral Daily  . ertapenem (INVANZ) IV  1 g Intravenous Q24H  . famotidine (PEPCID) IV  20 mg Intravenous Q24H  . heparin  5,000 Units Subcutaneous Q8H  . DISCONTD: morphine   Intravenous Q4H    Assessment/Plan Acute appendictis,perforatin and abscess, s/p Lap appendectomy, drainage of abscess and drain placement 12/28/11 Dr. Biagio Quint  WBC is up today  Post op ileus  Mild renal insuffiencey, resolved with hydration  BMI 38  Left Nephrolithiasis without hydronephrosis  Worsening post op hypertension, no prior medical evaluations since discharge from the [employer].   Plan:  Full liquids,  40 Ml thru drain and it's clear, discuss when to remove, still concerned about WBC. Hypertension being addressed by  Dr. Mahala Menghini, today will be the first full 24 hours of Rx.  We will continue to follow. HBA1 C also pending. (6.2) Will defer to Medicine.     LOS: 5 days  Roshell Brigham 01/02/2012

## 2012-01-02 NOTE — Consult Note (Signed)
[facility] Medical Consultation  Steve Rogers WUJ:[id number] DOB: [birth year] DOA: 12/28/2011 PCP: No primary provider on file.   Requesting physician: Phillips Grout Date of consultation: 01/01/12 Reason for consultation: Gen medical care-HTN  Impression/Recommendations Principal Problem:  *Acute fulminating appendicitis with perforation and peritonitis Active Problems:  Ileus following gastrointestinal surgery  Nephrolithiasis  BMI 38.0-38.9,adult  HTN (hypertension)  Pre-diabetes  Renal insufficiency    1. Hypertension, no evidence of urgency/emergency-would discontinue use of IV metoprolol given relative hypotension this morning.  Cont amlodipine 10 mg daily. start low-dose thiazide, HCTZ 12.5 mg.  Amlodipine is effect probably not be seen until 01/03/2012, would place on IV hydralazine until then (already done and ordered)-Given has ileus, there is theoretical potential for CCB to slow gut.  If still above 140/90 tomorrow am, Add Imdur 15 mg CD.  2. Diarrhea, potentially ileus-C. difficile 12/31/2011 negative.  Acute abdominal series 12/31/2011 shows multiple distended loops-her CCS if decision to order CT scan. Have advised patient to increase use of yogurt for probiotic effect-would try to limit carbapenem use as much as possible-this would be contingent upon surgeon 3. Nephrolithiasis on CT scan 12/28/2011-: Stent to finding-would monitor and follow closely 4. BMI 38-38.9-will need outpatient therapy and counseling.  Dietician referral when eating per CCS discretion 5. Pre-DM?-A1c 6.2, DM cut-off 6.5-counselled he needs PCP, Rpt A1c in 3 mo and Truncal obesity reduction,/DIet modification 6. Acute kidney injury secondary to sepsis/dehydration-improving-Can potentially d/c fluids if not done already  I will sign off currently-If there are questions, please page me directly.  Thank you for this consult  Doign well today, no real c/o.  Eating better, Stool more formed, no other  issue  Chief Complaint: Acute fulminant appendicitis  HPI:  46 [facility] 0.8 413 with nausea vomiting and ultimately found to have an appendicitis and seemed to be in overt sepsis-underwent laparoscopic appendectomy 12/28/2011, noted initially to be in acute kidney failure likely secondary to volume depletion with BUN and 40/creatinine 1.4, and leukocytosis of 14.6. Patient has no specific complaints today other than some mild abdominal pain and continued diarrhea and a cough that developed overnight but she thinks may have happened after the NG tube came out  Review of Systems:  No headache, no blurred  Vision, no double vision, +cough since this am with food, no consitaption, +diarrhoea--gong to the RR every 1-2 hours., seems aqua coloured and now is a dark seaweed green (Cdiff neg 8/7),  no obstipation, appetite has improved over 2 days.  +flatus, no fever/chill/cp/abdominla pain 2/10 currently-goes upto 2 with activity, no faint or dizzy feeling  Past Medical History  Diagnosis Date  . Ileus following gastrointestinal surgery 12/31/2011  . Nephrolithiasis 12/31/2011  . BMI 38.0-38.9,adult 12/31/2011  . HTN (hypertension)   . Pre-diabetes   . Morbid obesity with BMI of 40.0-44.9, adult   . Renal insufficiency 01/02/2012   Past Surgical History  Procedure Date  . Laparoscopic appendectomy 12/28/2011    Procedure: APPENDECTOMY LAPAROSCOPIC;  Surgeon: Lodema Pilot, DO;  Location: [facility];  Service: General;  Laterality: N/A;  laparoscopic appendectomy and drainage of intra abdominal abscess   Social History:  reports that he has never smoked. He does not have any smokeless tobacco history on file. He reports that he does not drink alcohol. His drug history not on file.  No Known Allergies Family History  Problem Relation Age of Onset  . Diabetes Mother   . Hypertension Mother     Prior to  Admission medications   Medication Sig Start Date End Date  Taking? Authorizing Provider  Psyllium (METAMUCIL PO) Take 2 capsules by mouth daily as needed. For digestive health.   Yes Historical Provider, MD   Physical Exam: Blood pressure 178/119, pulse 100, temperature 99.3 F (37.4 C), temperature source Oral, resp. rate 18, height 5\' 11"  (1.803 m), weight 125.646 kg (277 lb), SpO2 97.00%. Filed Vitals:   01/01/12 2200 01/02/12 0200 01/02/12 0600 01/02/12 1400  BP: 148/90 163/93 159/94 178/119  Pulse: 107 107 103 100  Temp: 98.8 F (37.1 C) 99.5 F (37.5 C) 99.6 F (37.6 C) 99.3 F (37.4 C)  TempSrc: Oral Oral Oral Oral  Resp: 18 18 18 18   Height:      Weight:      SpO2: 96% 96% 97% 97%     General:  Obese pleasant Caucasian male in no apparent distress  Eyes: No pallor no icterus  ENT: Soft supple, no erythema to throat  Neck: Soft supple no thyromegaly no JVD no carotid bruit  Cardiovascular: S1-S2 no murmur rub or gallop regular rate rhythm non-telemetry  Respiratory: Clear no added sound no tactile vocal resonance or fremitus  Abdomen: Distended.  No rebound guarding.  Bowel sounds diminished.  Drain noted in the right lower quadrant with minimal amount in back.  Well-healed laparoscopic scars  Skin: See above-no other findings  Musculoskeletal: Moving all 4 limbs equally  Psychiatric: Euthymic  Neurologic: Motor grossly intact, reflexes 5/5, sensation grossly intact, no cerebellar signs-gait not assessed  Labs on Admission:  Basic Metabolic Panel:  Lab 01/02/12 1308 01/01/12 0437 12/31/11 0432 12/30/11 0427 12/29/11 0432  NA 138 139 138 138 135  K 3.9 4.2 4.0 3.4* 4.3  CL 102 102 101 103 99  CO2 25 25 24 26 28   GLUCOSE 93 95 96 105* 129*  BUN 15 20 24* 29* 40*  CREATININE 0.93 0.99 0.95 1.09 1.40*  CALCIUM 8.4 8.8 8.5 8.3* 8.8  MG -- -- -- 2.4 --  PHOS -- -- -- -- --   Liver Function Tests:  Lab 01/02/12 0455 12/30/11 0427 12/29/11 0432 12/28/11 1229  AST 44* 49* 13 14  ALT 55* 47 18 26  ALKPHOS 149* 93  60 72  BILITOT 0.6 0.8 0.8 1.6*  PROT 5.9* 6.1 6.1 8.3  ALBUMIN 2.3* 2.2* 2.4* 3.5    Lab 12/28/11 1229  LIPASE 41  AMYLASE --   No results found for this basename: AMMONIA:5 in the last 168 hours CBC:  Lab 01/02/12 0455 01/01/12 0437 12/31/11 0432 12/30/11 0427 12/29/11 0432 12/28/11 1229  WBC 13.7* 13.5* 12.1* 9.5 14.6* --  NEUTROABS -- -- -- -- -- 17.7*  HGB 13.4 13.7 14.1 13.6 14.2 --  HCT 39.0 40.9 40.2 39.5 41.6 --  MCV 85.7 86.5 85.5 85.7 86.1 --  PLT 412* 409* 367 346 322 --   Cardiac Enzymes: No results found for this basename: CKTOTAL:5,CKMB:5,CKMBINDEX:5,TROPONINI:5 in the last 168 hours BNP: No components found with this basename: POCBNP:5 CBG: No results found for this basename: GLUCAP:5 in the last 168 hours  Radiological Exams on Admission: No results found.  Time spent: 50 minutes  Mahala Menghini [facility] Pager [phone]  If 7PM-7AM, please contact night-coverage [email or web address] Password [email or web address] 01/02/2012, 5:10 PM

## 2012-01-02 NOTE — Progress Notes (Signed)
Patient personally interviewed and examined. I agree with the assessment and treatment plan outlined by Mr. [name]. I have started him on probiotics. Drain removed. OK to shower.   Angelia Mould. Derrell Lolling, M.D., [facility] General and Minimally invasive Surgery Breast and Colorectal Surgery Office:   [phone] Pager:   [phone]

## 2012-01-03 LAB — CBC
HCT: 40.9 % (ref 39.0–52.0)
MCHC: 33.5 g/dL (ref 30.0–36.0)
MCV: 86.3 fL (ref 78.0–100.0)
Platelets: 459 10*3/uL — ABNORMAL HIGH (ref 150–400)
RDW: 13.4 % (ref 11.5–15.5)
WBC: 13 10*3/uL — ABNORMAL HIGH (ref 4.0–10.5)

## 2012-01-03 NOTE — Progress Notes (Signed)
Patient ID: Steve Rogers, male   DOB: 03-Jul-1965, 46 y.o.   MRN: [id number]  General Surgery - [facility] - Progress Note  POD# 7  Subjective: Patient up in chair.  Ambulatory.  Tolerating full liquid diet.  No nausea or emesis.  Pain well-controlled.  Objective: Vital signs in last 24 hours: Temp:  [98.8 F (37.1 C)-99.7 F (37.6 C)] 98.8 F (37.1 C) (08/10 0959) Pulse Rate:  [97-108] 108  (08/10 0959) Resp:  [18-20] 18  (08/10 0959) BP: (148-178)/(50-119) 157/95 mmHg (08/10 0959) SpO2:  [95 %-98 %] 98 % (08/10 0959) Last BM Date: 01/01/12  Intake/Output from previous day: 08/09 0701 - 08/10 0700 In: 1660 [P.O.:460; I.V.:1200] Out: 200 [Urine:200]  Exam: HEENT - clear, not icteric Neck - soft Chest - clear bilaterally Cor - RRR, no murmur Abd - obese, protuberant; BS present; wounds clear and dry; positive flatus, no BM Ext - no significant edema Neuro - grossly intact, no focal deficits  Lab Results:   Basename 01/03/12 0500 01/02/12 0455  WBC 13.0* 13.7*  HGB 13.7 13.4  HCT 40.9 39.0  PLT 459* 412*     Basename 01/02/12 0455 01/01/12 0437  NA 138 139  K 3.9 4.2  CL 102 102  CO2 25 25  GLUCOSE 93 95  BUN 15 20  CREATININE 0.93 0.99  CALCIUM 8.4 8.8    Studies/Results: No results found.  Assessment / Plan: 1.  Perforated appendicitis  - advance to regular diet  - continue IV Invanz  - OOB, ambulate, IS use encouraged  - will check CBC on Monday 8/12 - WBC remains elevated at 13K  Velora Heckler, MD, [facility] Office: [phone]  01/03/2012

## 2012-01-04 NOTE — Progress Notes (Signed)
Patient ID: Steve Rogers, male   DOB: 05-17-1966, 46 y.o.   MRN: [id number]  General Surgery - [facility] - Progress Note  POD# 7  Subjective: Patient up to chair.  Tolerating regular diet.  No nausea or emesis.  Pain well controlled. No sweats.  BM's becoming formed.  Objective: Vital signs in last 24 hours: Temp:  [98.8 F (37.1 C)-100.4 F (38 C)] 99 F (37.2 C) (08/11 0500) Pulse Rate:  [98-113] 98  (08/11 0500) Resp:  [17-18] 18  (08/11 0500) BP: (136-157)/(84-95) 145/91 mmHg (08/11 0500) SpO2:  [95 %-98 %] 96 % (08/11 0500) Last BM Date: 01/04/12  Intake/Output from previous day: 08/10 0701 - 08/11 0700 In: 2504.2 [P.O.:960; I.V.:1544.2] Out: -   Exam: HEENT - clear, not icteric Neck - soft Chest - clear bilaterally Cor - RRR, no murmur Abd - obese, mild distension; BS present; wounds clear and dry Ext - no significant edema Neuro - grossly intact, no focal deficits  Lab Results:   Basename 01/03/12 0500 01/02/12 0455  WBC 13.0* 13.7*  HGB 13.7 13.4  HCT 40.9 39.0  PLT 459* 412*     Basename 01/02/12 0455  NA 138  K 3.9  CL 102  CO2 25  GLUCOSE 93  BUN 15  CREATININE 0.93  CALCIUM 8.4    Studies/Results: No results found.  Assessment / Plan: 1.  Status post lap appendectomy for complicated appendicitis  - IV Invanz day #7  - repeat CBC in AM 8/12  - possibly home on po abx's tomorrow with WBC normalized  Velora Heckler, MD, [facility] Office: [phone]  01/04/2012

## 2012-01-05 ENCOUNTER — Inpatient Hospital Stay (HOSPITAL_COMMUNITY): Payer: MEDICAID

## 2012-01-05 LAB — CBC WITH DIFFERENTIAL/PLATELET
Eosinophils Absolute: 0.3 10*3/uL (ref 0.0–0.7)
Eosinophils Relative: 2 % (ref 0–5)
MCH: 29.1 pg (ref 26.0–34.0)
Monocytes Absolute: 1.3 10*3/uL — ABNORMAL HIGH (ref 0.1–1.0)
Neutrophils Relative %: 77 % (ref 43–77)
Platelets: 385 10*3/uL (ref 150–400)
RBC: 4.47 MIL/uL (ref 4.22–5.81)

## 2012-01-05 MED ORDER — IOHEXOL 300 MG/ML  SOLN
100.0000 mL | Freq: Once | INTRAMUSCULAR | Status: AC | PRN
  Administered 2012-01-05: 100 mL via INTRAVENOUS

## 2012-01-05 NOTE — Progress Notes (Signed)
Patient ID: Steve Rogers, male   DOB: June 11, 1965, 46 y.o.   MRN: [id number] 8 Days Post-Op  Subjective: Pt feels great.  Eating solid food.  No pain.  Objective: Vital signs in last 24 hours: Temp:  [98.3 F (36.8 C)-100 F (37.8 C)] 99.4 F (37.4 C) (08/12 0550) Pulse Rate:  [93-108] 99  (08/12 0550) Resp:  [18] 18  (08/12 0550) BP: (134-150)/(89-91) 134/91 mmHg (08/12 0550) SpO2:  [95 %-96 %] 95 % (08/12 0550) Last BM Date: 01/04/12  Intake/Output from previous day: 08/11 0701 - 08/12 0700 In: 1690 [P.O.:480; I.V.:1210] Out: -  Intake/Output this shift:    PE: Abd: soft, NT, ND, obese, +BS, incisions c/d/i   Lab Results:   Basename 01/05/12 0409 01/03/12 0500  WBC 16.0* 13.0*  HGB 13.0 13.7  HCT 38.8* 40.9  PLT 385 459*   BMET No results found for this basename: NA:2,K:2,CL:2,CO2:2,GLUCOSE:2,BUN:2,CREATININE:2,CALCIUM:2 in the last 72 hours PT/INR No results found for this basename: LABPROT:2,INR:2 in the last 72 hours CMP     Component Value Date/Time   NA 138 01/02/2012 0455   K 3.9 01/02/2012 0455   CL 102 01/02/2012 0455   CO2 25 01/02/2012 0455   GLUCOSE 93 01/02/2012 0455   BUN 15 01/02/2012 0455   CREATININE 0.93 01/02/2012 0455   CALCIUM 8.4 01/02/2012 0455   PROT 5.9* 01/02/2012 0455   ALBUMIN 2.3* 01/02/2012 0455   AST 44* 01/02/2012 0455   ALT 55* 01/02/2012 0455   ALKPHOS 149* 01/02/2012 0455   BILITOT 0.6 01/02/2012 0455   GFRNONAA >90 01/02/2012 0455   GFRAA >90 01/02/2012 0455   Lipase     Component Value Date/Time   LIPASE 41 12/28/2011 1229       Studies/Results: No results found.  Anti-infectives: Anti-infectives     Start     Dose/Rate Route Frequency Ordered Stop   12/29/11 1500   ertapenem (INVANZ) 1 g in sodium chloride 0.9 % 50 mL IVPB        1 g 100 mL/hr over 30 Minutes Intravenous Every 24 hours 12/28/11 2121     12/28/11 1500   ertapenem (INVANZ) 1 g in sodium chloride 0.9 % 50 mL IVPB        1 g 100 mL/hr over 30 Minutes Intravenous  Once  12/28/11 1456 12/28/11 1514           Assessment/Plan  1. S/p lap appy for perf appy 2. Leukocytosis  Plan: 1. Will get a CT scan to rule out post op complication given increase in WBC 2. Recheck CBC in am   LOS: 8 days    Skylin Kennerson E 01/05/2012

## 2012-01-05 NOTE — Progress Notes (Signed)
Patient says he feels pretty well. He is been ambulating and up and around. Not having significant abdominal pain.  Abdomen is soft and benign. Wounds are healing nicely.  White cell count is up to 16,000 from 13,000 2 days ago.  Impression overall appears to be doing well but white cell count elevation worrisome for developing abscess  Plan: We'll arrange for abdominal pelvic CT scan to rule out developing abscess. He will be to continue antibiotics when he goes home. If there is no significant process intra-abdominal he is close to being able to go home now.

## 2012-01-06 ENCOUNTER — Inpatient Hospital Stay (HOSPITAL_COMMUNITY): Payer: MEDICAID

## 2012-01-06 LAB — CBC
HCT: 39 % (ref 39.0–52.0)
Platelets: 444 10*3/uL — ABNORMAL HIGH (ref 150–400)
RDW: 13.2 % (ref 11.5–15.5)
WBC: 18.6 10*3/uL — ABNORMAL HIGH (ref 4.0–10.5)

## 2012-01-06 LAB — APTT: aPTT: 32 seconds (ref 24–37)

## 2012-01-06 LAB — PROTIME-INR: INR: 1.12 (ref 0.00–1.49)

## 2012-01-06 MED ORDER — FENTANYL CITRATE 0.05 MG/ML IJ SOLN
INTRAMUSCULAR | Status: AC | PRN
  Administered 2012-01-06 (×2): 100 ug via INTRAVENOUS

## 2012-01-06 MED ORDER — MIDAZOLAM HCL 5 MG/5ML IJ SOLN
INTRAMUSCULAR | Status: AC | PRN
  Administered 2012-01-06 (×2): 2 mg via INTRAVENOUS

## 2012-01-06 MED ORDER — HEPARIN SODIUM (PORCINE) 5000 UNIT/ML IJ SOLN
5000.0000 [IU] | Freq: Three times a day (TID) | INTRAMUSCULAR | Status: DC
  Administered 2012-01-06 – 2012-01-07 (×2): 5000 [IU] via SUBCUTANEOUS
  Filled 2012-01-06 (×6): qty 1

## 2012-01-06 NOTE — Progress Notes (Signed)
Patient ID: Steve Rogers, male   DOB: 08/07/1965, 46 y.o.   MRN: [id number] Patient scheduled for CT guided pelvic abscess drainage today. Imaging studies reviewed by Dr. Grace Isaac. PMH as noted below. Exam; chest - few right basilar crackles, left clear; heart-RRR. abd- obese, soft, +BS, NT.   Filed Vitals:   01/05/12 0550 01/05/12 1400 01/05/12 2117 01/06/12 0537  BP: 134/91 165/89 129/80 133/85  Pulse: 99 99 108 98  Temp: 99.4 F (37.4 C) 99.7 F (37.6 C) 99.9 F (37.7 C) 99.3 F (37.4 C)  TempSrc: Oral Oral Oral Oral  Resp: 18 16 18 20   Height:      Weight:      SpO2: 95% 100% 98% 96%   Past Medical History  Diagnosis Date  . Ileus following gastrointestinal surgery 12/31/2011  . Nephrolithiasis 12/31/2011  . BMI 38.0-38.9,adult 12/31/2011  . HTN (hypertension)   . Pre-diabetes   . Morbid obesity with BMI of 40.0-44.9, adult   . Renal insufficiency 01/02/2012   Past Surgical History  Procedure Date  . Laparoscopic appendectomy 12/28/2011    Procedure: APPENDECTOMY LAPAROSCOPIC;  Surgeon: Lodema Pilot, DO;  Location: [facility];  Service: General;  Laterality: N/A;  laparoscopic appendectomy and drainage of intra abdominal abscess   Ct Abdomen Pelvis Wo Contrast  12/28/2011  *RADIOLOGY REPORT*  Clinical Data: Abdominal pain, nausea, elevated creatinine  CT ABDOMEN AND PELVIS WITHOUT CONTRAST  Technique:  Multidetector CT imaging of the abdomen and pelvis was performed following the standard protocol without intravenous contrast.  Comparison: None.  Findings: Scarring or subsegmental atelectasis posteriorly in the visualized lung bases.  Unremarkable uninfused evaluation of liver, gallbladder, adrenal glands, pancreas.  There is high attenuation material in the lumen of the gallbladder.  Left nephrolithiasis, 6 mm calculus in the lower pole renal collecting system.  2 cm fluid attenuation lesion in the interpolar region left kidney. No hydronephrosis.  Ureters decompressed without calculus.  Stomach is  nondistended.  There are multiple mid abdominal distended small bowel loops and a single dilated loop of small bowel in the mid abdomen measuring 5 cm transverse diameter.  Loops of more distal ileum are surrounded by mild inflammatory/edematous changes.  The appendix is dilated up to 18 mm diameter, with mild wall thickening, containing gas and appendicoliths with adjacent inflammatory/edematous change.  No definite extraluminal fluid collections.  The colon is decompressed.  Urinary bladder incompletely distended.  No free air.  No ascites.  IMPRESSION:  1.  Distended appendix with appendicoliths, wall thickening, and adjacent inflammatory/inflammatory changes suggesting acute appendicitis.  No evidence of perforation or abscess. 2.  Left nephrolithiasis without hydronephrosis.  Original Report Authenticated By: Osa Craver, M.D.   Ct Abdomen Pelvis W Contrast  01/05/2012  *RADIOLOGY REPORT*  Clinical Data: Perforated appendicitis, postop  CT ABDOMEN AND PELVIS WITH CONTRAST  Technique:  Multidetector CT imaging of the abdomen and pelvis was performed following the standard protocol during bolus administration of intravenous contrast.  Contrast: OMNIPAQUE IOHEXOL 300 MG/ML  SOLN  Comparison: 12/28/2011  Findings: Minimal linear subsegmental atelectasis or scarring in the visualized lung bases.  Unremarkable liver, nondistended gallbladder, spleen and accessory splenule, adrenal glands, pancreas, right kidney.  Stable left renal cyst.   4 mm calculus in the lower pole left renal collecting system as before.  No hydronephrosis on delayed scans.    Patchy aortic calcification without aneurysm.  Stomach is physiologically distended.  Small bowel is decompressed. There is a staple line at the base of  the cecum.  The colon is nondilated.  There is a enlarging 6.7 x 10.3 cm probably loculated fluid collection in the cul-de-sac extending up cephalad to the urinary bladder, containing mostly fluid and a  few small loculated bubbles of gas.  There are moderate regional inflammatory/edematous changes.  No other definite extraluminal fluid collections are identified.  Degenerative disc disease L5-S1.  IMPRESSION:  1.  Interval increase in size of   loculated pelvic abscess, now 10.3 cm maximum transverse diameter.  This would likely be approachable for percutaneous drainage under imaging guidance.  Original Report Authenticated By: Osa Craver, M.D.   Dg Abd 2 Views  12/31/2011  *RADIOLOGY REPORT*  Clinical Data: Abdominal distention.  Ruptured appendicitis.  ABDOMEN - 2 VIEW  Comparison: CT scan dated 12/28/2011  Findings: NG tube tip is barely in the fundus of the stomach. There are multiple slightly distended loops of small bowel.  The colon is not distended.  Drain is seen in the right lower quadrant.  IMPRESSION: Multiple distended loops small bowel, essentially unchanged since the preoperative exam.  Drain and NG tube in place as described.  Original Report Authenticated By: Gwynn Burly, M.D.  Results for orders placed during the hospital encounter of 12/28/11  CBC WITH DIFFERENTIAL      Component Value Range   WBC 21.9 (*) 4.0 - 10.5 K/uL   RBC 5.89 (*) 4.22 - 5.81 MIL/uL   Hemoglobin 17.7 (*) 13.0 - 17.0 g/dL   HCT 72.5  36.6 - 44.0 %   MCV 82.3  78.0 - 100.0 fL   MCH 30.1  26.0 - 34.0 pg   MCHC 36.5 (*) 30.0 - 36.0 g/dL   RDW 34.7  42.5 - 95.6 %   Platelets 410 (*) 150 - 400 K/uL   Neutrophils Relative 81 (*) 43 - 77 %   Lymphocytes Relative 15  12 - 46 %   Monocytes Relative 4  3 - 12 %   Eosinophils Relative 0  0 - 5 %   Basophils Relative 0  0 - 1 %   Band Neutrophils 0  0 - 10 %   Metamyelocytes Relative 0     Myelocytes 0     Promyelocytes Absolute 0     Blasts 0     nRBC 0  0 /100 WBC   Neutro Abs 17.7 (*) 1.7 - 7.7 K/uL   Lymphs Abs 3.3  0.7 - 4.0 K/uL   Monocytes Absolute 0.9  0.1 - 1.0 K/uL   Eosinophils Absolute 0.0  0.0 - 0.7 K/uL   Basophils Absolute 0.0   0.0 - 0.1 K/uL   Smear Review LARGE PLATELETS PRESENT    LIPASE, BLOOD      Component Value Range   Lipase 41  11 - 59 U/L  COMPREHENSIVE METABOLIC PANEL      Component Value Range   Sodium 132 (*) 135 - 145 mEq/L   Potassium 3.5  3.5 - 5.1 mEq/L   Chloride 89 (*) 96 - 112 mEq/L   CO2 28  19 - 32 mEq/L   Glucose, Bld 182 (*) 70 - 99 mg/dL   BUN 46 (*) 6 - 23 mg/dL   Creatinine, Ser 3.87 (*) 0.50 - 1.35 mg/dL   Calcium 56.4  8.4 - 33.2 mg/dL   Total Protein 8.3  6.0 - 8.3 g/dL   Albumin 3.5  3.5 - 5.2 g/dL   AST 14  0 - 37 U/L   ALT 26  0 - 53 U/L   Alkaline Phosphatase 72  39 - 117 U/L   Total Bilirubin 1.6 (*) 0.3 - 1.2 mg/dL   GFR calc non Af Amer 40 (*) >90 mL/min   GFR calc Af Amer 47 (*) >90 mL/min  URINALYSIS, ROUTINE W REFLEX MICROSCOPIC      Component Value Range   Color, Urine AMBER (*) YELLOW   APPearance TURBID (*) CLEAR   Specific Gravity, Urine 1.037 (*) 1.005 - 1.030   pH 5.0  5.0 - 8.0   Glucose, UA NEGATIVE  NEGATIVE mg/dL   Hgb urine dipstick LARGE (*) NEGATIVE   Bilirubin Urine MODERATE (*) NEGATIVE   Ketones, ur 15 (*) NEGATIVE mg/dL   Protein, ur 161 (*) NEGATIVE mg/dL   Urobilinogen, UA 1.0  0.0 - 1.0 mg/dL   Nitrite POSITIVE (*) NEGATIVE   Leukocytes, UA SMALL (*) NEGATIVE  URINE MICROSCOPIC-ADD ON      Component Value Range   Squamous Epithelial / LPF RARE  RARE   WBC, UA 3-6  <3 WBC/hpf   RBC / HPF 11-20  <3 RBC/hpf   Bacteria, UA MANY (*) RARE   Casts HYALINE CASTS (*) NEGATIVE  COMPREHENSIVE METABOLIC PANEL      Component Value Range   Sodium 135  135 - 145 mEq/L   Potassium 4.3  3.5 - 5.1 mEq/L   Chloride 99  96 - 112 mEq/L   CO2 28  19 - 32 mEq/L   Glucose, Bld 129 (*) 70 - 99 mg/dL   BUN 40 (*) 6 - 23 mg/dL   Creatinine, Ser 0.96 (*) 0.50 - 1.35 mg/dL   Calcium 8.8  8.4 - 04.5 mg/dL   Total Protein 6.1  6.0 - 8.3 g/dL   Albumin 2.4 (*) 3.5 - 5.2 g/dL   AST 13  0 - 37 U/L   ALT 18  0 - 53 U/L   Alkaline Phosphatase 60  39 - 117  U/L   Total Bilirubin 0.8  0.3 - 1.2 mg/dL   GFR calc non Af Amer 58 (*) >90 mL/min   GFR calc Af Amer 68 (*) >90 mL/min  CBC      Component Value Range   WBC 14.6 (*) 4.0 - 10.5 K/uL   RBC 4.83  4.22 - 5.81 MIL/uL   Hemoglobin 14.2  13.0 - 17.0 g/dL   HCT 40.9  81.1 - 91.4 %   MCV 86.1  78.0 - 100.0 fL   MCH 29.4  26.0 - 34.0 pg   MCHC 34.1  30.0 - 36.0 g/dL   RDW 78.2  95.6 - 21.3 %   Platelets 322  150 - 400 K/uL  CBC      Component Value Range   WBC 9.5  4.0 - 10.5 K/uL   RBC 4.61  4.22 - 5.81 MIL/uL   Hemoglobin 13.6  13.0 - 17.0 g/dL   HCT 08.6  57.8 - 46.9 %   MCV 85.7  78.0 - 100.0 fL   MCH 29.5  26.0 - 34.0 pg   MCHC 34.4  30.0 - 36.0 g/dL   RDW 62.9  52.8 - 41.3 %   Platelets 346  150 - 400 K/uL  COMPREHENSIVE METABOLIC PANEL      Component Value Range   Sodium 138  135 - 145 mEq/L   Potassium 3.4 (*) 3.5 - 5.1 mEq/L   Chloride 103  96 - 112 mEq/L   CO2 26  19 - 32 mEq/L  Glucose, Bld 105 (*) 70 - 99 mg/dL   BUN 29 (*) 6 - 23 mg/dL   Creatinine, Ser 1.61  0.50 - 1.35 mg/dL   Calcium 8.3 (*) 8.4 - 10.5 mg/dL   Total Protein 6.1  6.0 - 8.3 g/dL   Albumin 2.2 (*) 3.5 - 5.2 g/dL   AST 49 (*) 0 - 37 U/L   ALT 47  0 - 53 U/L   Alkaline Phosphatase 93  39 - 117 U/L   Total Bilirubin 0.8  0.3 - 1.2 mg/dL   GFR calc non Af Amer 79 (*) >90 mL/min   GFR calc Af Amer >90  >90 mL/min  MAGNESIUM      Component Value Range   Magnesium 2.4  1.5 - 2.5 mg/dL  CBC      Component Value Range   WBC 12.1 (*) 4.0 - 10.5 K/uL   RBC 4.70  4.22 - 5.81 MIL/uL   Hemoglobin 14.1  13.0 - 17.0 g/dL   HCT 09.6  04.5 - 40.9 %   MCV 85.5  78.0 - 100.0 fL   MCH 30.0  26.0 - 34.0 pg   MCHC 35.1  30.0 - 36.0 g/dL   RDW 81.1  91.4 - 78.2 %   Platelets 367  150 - 400 K/uL  BASIC METABOLIC PANEL      Component Value Range   Sodium 138  135 - 145 mEq/L   Potassium 4.0  3.5 - 5.1 mEq/L   Chloride 101  96 - 112 mEq/L   CO2 24  19 - 32 mEq/L   Glucose, Bld 96  70 - 99 mg/dL   BUN 24  (*) 6 - 23 mg/dL   Creatinine, Ser 9.56  0.50 - 1.35 mg/dL   Calcium 8.5  8.4 - 21.3 mg/dL   GFR calc non Af Amer >90  >90 mL/min   GFR calc Af Amer >90  >90 mL/min  CLOSTRIDIUM DIFFICILE BY PCR      Component Value Range   C difficile by pcr NEGATIVE  NEGATIVE  CBC      Component Value Range   WBC 13.5 (*) 4.0 - 10.5 K/uL   RBC 4.73  4.22 - 5.81 MIL/uL   Hemoglobin 13.7  13.0 - 17.0 g/dL   HCT 08.6  57.8 - 46.9 %   MCV 86.5  78.0 - 100.0 fL   MCH 29.0  26.0 - 34.0 pg   MCHC 33.5  30.0 - 36.0 g/dL   RDW 62.9  52.8 - 41.3 %   Platelets 409 (*) 150 - 400 K/uL  BASIC METABOLIC PANEL      Component Value Range   Sodium 139  135 - 145 mEq/L   Potassium 4.2  3.5 - 5.1 mEq/L   Chloride 102  96 - 112 mEq/L   CO2 25  19 - 32 mEq/L   Glucose, Bld 95  70 - 99 mg/dL   BUN 20  6 - 23 mg/dL   Creatinine, Ser 2.44  0.50 - 1.35 mg/dL   Calcium 8.8  8.4 - 01.0 mg/dL   GFR calc non Af Amer >90  >90 mL/min   GFR calc Af Amer >90  >90 mL/min  CBC      Component Value Range   WBC 13.7 (*) 4.0 - 10.5 K/uL   RBC 4.55  4.22 - 5.81 MIL/uL   Hemoglobin 13.4  13.0 - 17.0 g/dL   HCT 27.2  53.6 - 64.4 %  MCV 85.7  78.0 - 100.0 fL   MCH 29.5  26.0 - 34.0 pg   MCHC 34.4  30.0 - 36.0 g/dL   RDW 16.1  09.6 - 04.5 %   Platelets 412 (*) 150 - 400 K/uL  HEMOGLOBIN A1C      Component Value Range   Hemoglobin A1C 6.2 (*) <5.7 %   Mean Plasma Glucose 131 (*) <117 mg/dL  COMPREHENSIVE METABOLIC PANEL      Component Value Range   Sodium 138  135 - 145 mEq/L   Potassium 3.9  3.5 - 5.1 mEq/L   Chloride 102  96 - 112 mEq/L   CO2 25  19 - 32 mEq/L   Glucose, Bld 93  70 - 99 mg/dL   BUN 15  6 - 23 mg/dL   Creatinine, Ser 4.09  0.50 - 1.35 mg/dL   Calcium 8.4  8.4 - 81.1 mg/dL   Total Protein 5.9 (*) 6.0 - 8.3 g/dL   Albumin 2.3 (*) 3.5 - 5.2 g/dL   AST 44 (*) 0 - 37 U/L   ALT 55 (*) 0 - 53 U/L   Alkaline Phosphatase 149 (*) 39 - 117 U/L   Total Bilirubin 0.6  0.3 - 1.2 mg/dL   GFR calc non Af Amer  >90  >90 mL/min   GFR calc Af Amer >90  >90 mL/min  CBC      Component Value Range   WBC 13.0 (*) 4.0 - 10.5 K/uL   RBC 4.74  4.22 - 5.81 MIL/uL   Hemoglobin 13.7  13.0 - 17.0 g/dL   HCT 91.4  78.2 - 95.6 %   MCV 86.3  78.0 - 100.0 fL   MCH 28.9  26.0 - 34.0 pg   MCHC 33.5  30.0 - 36.0 g/dL   RDW 21.3  08.6 - 57.8 %   Platelets 459 (*) 150 - 400 K/uL  CBC WITH DIFFERENTIAL      Component Value Range   WBC 16.0 (*) 4.0 - 10.5 K/uL   RBC 4.47  4.22 - 5.81 MIL/uL   Hemoglobin 13.0  13.0 - 17.0 g/dL   HCT 46.9 (*) 62.9 - 52.8 %   MCV 86.8  78.0 - 100.0 fL   MCH 29.1  26.0 - 34.0 pg   MCHC 33.5  30.0 - 36.0 g/dL   RDW 41.3  24.4 - 01.0 %   Platelets 385  150 - 400 K/uL   Neutrophils Relative 77  43 - 77 %   Lymphocytes Relative 13  12 - 46 %   Monocytes Relative 8  3 - 12 %   Eosinophils Relative 2  0 - 5 %   Basophils Relative 0  0 - 1 %   Neutro Abs 12.3 (*) 1.7 - 7.7 K/uL   Lymphs Abs 2.1  0.7 - 4.0 K/uL   Monocytes Absolute 1.3 (*) 0.1 - 1.0 K/uL   Eosinophils Absolute 0.3  0.0 - 0.7 K/uL   Basophils Absolute 0.0  0.0 - 0.1 K/uL   WBC Morphology MILD LEFT SHIFT (1-5% METAS, OCC MYELO, OCC BANDS)    CBC      Component Value Range   WBC 18.6 (*) 4.0 - 10.5 K/uL   RBC 4.53  4.22 - 5.81 MIL/uL   Hemoglobin 13.2  13.0 - 17.0 g/dL   HCT 27.2  53.6 - 64.4 %   MCV 86.1  78.0 - 100.0 fL   MCH 29.1  26.0 - 34.0 pg  MCHC 33.8  30.0 - 36.0 g/dL   RDW 16.1  09.6 - 04.5 %   Platelets 444 (*) 150 - 400 K/uL   A/P: Patient with hx of perforated appendicitis, s/p appendectomy 12/28/11; now with elevated WBC and pelvic abscess; plan is for CT guided drainage of abscess. Details/risks of procedure d/w pt with his understanding and consent.

## 2012-01-06 NOTE — Progress Notes (Signed)
9 Days Post-Op  Subjective: Patient feels okay. Not having any significant pain. Tolerating solid diet.  Objective: Vital signs in last 24 hours: Temp:  [99.3 F (37.4 C)-99.9 F (37.7 C)] 99.3 F (37.4 C) (08/13 0537) Pulse Rate:  [98-108] 98  (08/13 0537) Resp:  [16-20] 20  (08/13 0537) BP: (129-165)/(80-89) 133/85 mmHg (08/13 0537) SpO2:  [96 %-100 %] 96 % (08/13 0537)   Intake/Output from previous day: 08/12 0701 - 08/13 0700 In: 1553.3 [P.O.:360; I.V.:1193.3] Out: 1950 [Urine:1950] Intake/Output this shift:     General appearance: alert and no distress  Incision: healing well  Lab Results:   Basename 01/06/12 0405 01/05/12 0409  WBC 18.6* 16.0*  HGB 13.2 13.0  HCT 39.0 38.8*  PLT 444* 385   BMET No results found for this basename: NA:2,K:2,CL:2,CO2:2,GLUCOSE:2,BUN:2,CREATININE:2,CALCIUM:2 in the last 72 hours PT/INR No results found for this basename: LABPROT:2,INR:2 in the last 72 hours ABG No results found for this basename: PHART:2,PCO2:2,PO2:2,HCO3:2 in the last 72 hours  MEDS, Scheduled    . acidophilus  2 capsule Oral Daily  . amLODipine  10 mg Oral Daily  . ertapenem (INVANZ) IV  1 g Intravenous Q24H  . famotidine (PEPCID) IV  20 mg Intravenous Q24H  . heparin  5,000 Units Subcutaneous Q8H  . DISCONTD: heparin  5,000 Units Subcutaneous Q8H    Studies/Results: Ct Abdomen Pelvis W Contrast  01/05/2012  *RADIOLOGY REPORT*  Clinical Data: Perforated appendicitis, postop  CT ABDOMEN AND PELVIS WITH CONTRAST  Technique:  Multidetector CT imaging of the abdomen and pelvis was performed following the standard protocol during bolus administration of intravenous contrast.  Contrast: OMNIPAQUE IOHEXOL 300 MG/ML  SOLN  Comparison: 12/28/2011  Findings: Minimal linear subsegmental atelectasis or scarring in the visualized lung bases.  Unremarkable liver, nondistended gallbladder, spleen and accessory splenule, adrenal glands, pancreas, right kidney.   Stable left renal cyst.   4 mm calculus in the lower pole left renal collecting system as before.  No hydronephrosis on delayed scans.    Patchy aortic calcification without aneurysm.  Stomach is physiologically distended.  Small bowel is decompressed. There is a staple line at the base of the cecum.  The colon is nondilated.  There is a enlarging 6.7 x 10.3 cm probably loculated fluid collection in the cul-de-sac extending up cephalad to the urinary bladder, containing mostly fluid and a few small loculated bubbles of gas.  There are moderate regional inflammatory/edematous changes.  No other definite extraluminal fluid collections are identified.  Degenerative disc disease L5-S1.  IMPRESSION:  1.  Interval increase in size of   loculated pelvic abscess, now 10.3 cm maximum transverse diameter.  This would likely be approachable for percutaneous drainage under imaging guidance.  Original Report Authenticated By: Osa Craver, M.D.    Assessment: s/p Procedure(s): APPENDECTOMY LAPAROSCOPIC Patient Active Problem List  Diagnosis  . Acute fulminating appendicitis with perforation and peritonitis  . Ileus following gastrointestinal surgery  . Nephrolithiasis  . BMI 38.0-38.9,adult  . HTN (hypertension)  . Pre-diabetes  . Renal insufficiency    Pelvic abscess, status post appendectomy for perforated appendicitis  Plan: we will arrange percutaneous drainage of the 10 cm pelvic abscess for later today. It made him n.p.o. I discussed situation with the patient and he understands and understands that he will need to be in the hospital a few more days.   LOS: 9 days     Currie Paris, MD, [facility], [place] [phone]  01/06/2012 7:37 AM

## 2012-01-06 NOTE — Procedures (Signed)
Successful placement of a 10 French drain into the abscess/fluid collection within the pelvic abscess via left transgluteal approach Approximately 150 mL of purulent fluid aspirated. Samples sent to lab as requested. No immediate post procedural complications.

## 2012-01-07 LAB — BASIC METABOLIC PANEL
BUN: 13 mg/dL (ref 6–23)
CO2: 27 mEq/L (ref 19–32)
Chloride: 99 mEq/L (ref 96–112)
Creatinine, Ser: 0.95 mg/dL (ref 0.50–1.35)

## 2012-01-07 LAB — CBC
HCT: 39.8 % (ref 39.0–52.0)
MCV: 86.5 fL (ref 78.0–100.0)
RBC: 4.6 MIL/uL (ref 4.22–5.81)
WBC: 15 10*3/uL — ABNORMAL HIGH (ref 4.0–10.5)

## 2012-01-07 MED ORDER — CIPROFLOXACIN HCL 500 MG PO TABS: 500.0000 mg | ORAL_TABLET | Freq: Two times a day (BID) | ORAL | Status: AC

## 2012-01-07 MED ORDER — METRONIDAZOLE 500 MG PO TABS: 500.0000 mg | ORAL_TABLET | Freq: Three times a day (TID) | ORAL | Status: AC

## 2012-01-07 MED ORDER — OXYCODONE-ACETAMINOPHEN 5-325 MG PO TABS: 1.0000 | ORAL_TABLET | ORAL | Status: AC | PRN

## 2012-01-07 MED ORDER — AMLODIPINE BESYLATE 10 MG PO TABS: 10.0000 mg | ORAL_TABLET | Freq: Every day | ORAL | Status: DC

## 2012-01-07 NOTE — Progress Notes (Signed)
Subjective: Pt ok. Mild pain No new c/o  Objective: Physical Exam: BP 136/90  Pulse 94  Temp 97.9 F (36.6 C) (Oral)  Resp 18  Ht 5\' 11"  (1.803 m)  Wt 277 lb (125.646 kg)  BMI 38.63 kg/m2  SpO2 96% (L)transgluteal drain site intact, NT Drain with purulent output, 45cc recorded after 150cc removed at time of procedure. Small amount in bag this am.   Labs: CBC  Basename 01/07/12 0341 01/06/12 0405  WBC 15.0* 18.6*  HGB 13.3 13.2  HCT 39.8 39.0  PLT 481* 444*   BMET  Basename 01/07/12 0341  NA 136  K 4.1  CL 99  CO2 27  GLUCOSE 132*  BUN 13  CREATININE 0.95  CALCIUM 8.9   LFT No results found for this basename: PROT,ALBUMIN,AST,ALT,ALKPHOS,BILITOT,BILIDIR,IBILI,LIPASE in the last 72 hours PT/INR  Basename 01/06/12 0939  LABPROT 14.6  INR 1.12     Studies/Results: Ct Guided Abscess Drain  01/06/2012  *RADIOLOGY REPORT*  Indication: Pelvic abscess post appendectomy.  CT GUIDED LEFT TRANSGLUTEAL DRAINAGE CATHETER PLACEMENT  Comparison: CT abdomen pelvis - 01/05/2012; 12/28/2011  Medications: Fentanyl 200 mcg IV; Versed 4 mg IV  Total Moderate Sedation time: 20 minutes  Contrast: None  Complications: None immediate  Technique / Findings:  Informed written consent was obtained from the patient after a discussion of the risks, benefits and alternatives to treatment. The patient was placed prone on the CT gantry and a pre procedural CT was performed re-demonstrating the known abscess/fluid collection within the pelvis.  The procedure was planned.   A timeout was performed prior to the initiation of the procedure.  The left buttock was prepped and draped in the usual sterile fashion.   The overlying soft tissues were anesthetized with 1% lidocaine with epinephrine.  An 18 gauge trocar needle was advanced in to the abscess/fluid collection and a short Amplatz super stiff wire was coiled within the abscess/fluid collection.   Appropriate positioning was confirmed with a  limited CT scan.  The tract was serially dilated allowing placement of a 10 [place] all-purpose drainage catheter.  Appropriate positioning was confirmed with a limited postprocedural CT scan.  150 ml of purulent fluid was aspirated.  The tube was connected to a drainage bag and sutured in place.  A dressing was placed.  The patient tolerated the procedure well without immediate post procedural complication.  Impression:  Successful CT guided placement of a 10 French all purpose drain catheter into the left hemipelvis via transgluteal approach with aspiration of 150 mL of purulent fluid.  Samples were sent to the laboratory as requested by the ordering clinical team.  Original Report Authenticated By: Waynard Reeds, M.D.   Ct Abdomen Pelvis W Contrast  01/05/2012  *RADIOLOGY REPORT*  Clinical Data: Perforated appendicitis, postop  CT ABDOMEN AND PELVIS WITH CONTRAST  Technique:  Multidetector CT imaging of the abdomen and pelvis was performed following the standard protocol during bolus administration of intravenous contrast.  Contrast: OMNIPAQUE IOHEXOL 300 MG/ML  SOLN  Comparison: 12/28/2011  Findings: Minimal linear subsegmental atelectasis or scarring in the visualized lung bases.  Unremarkable liver, nondistended gallbladder, spleen and accessory splenule, adrenal glands, pancreas, right kidney.  Stable left renal cyst.   4 mm calculus in the lower pole left renal collecting system as before.  No hydronephrosis on delayed scans.    Patchy aortic calcification without aneurysm.  Stomach is physiologically distended.  Small bowel is decompressed. There is a staple line at the base  of the cecum.  The colon is nondilated.  There is a enlarging 6.7 x 10.3 cm probably loculated fluid collection in the cul-de-sac extending up cephalad to the urinary bladder, containing mostly fluid and a few small loculated bubbles of gas.  There are moderate regional inflammatory/edematous changes.  No other definite  extraluminal fluid collections are identified.  Degenerative disc disease L5-S1.  IMPRESSION:  1.  Interval increase in size of   loculated pelvic abscess, now 10.3 cm maximum transverse diameter.  This would likely be approachable for percutaneous drainage under imaging guidance.  Original Report Authenticated By: Osa Craver, M.D.    Assessment/Plan: S/p perc drain of pelvic fluid collection. Plans per [facility] for DC today Discussed qd/qod flushes with pt. Follow up CT in about a week with possible drain removal.   LOS: 10 days    Brayton El PA-C 01/07/2012 10:55 AM

## 2012-01-07 NOTE — Discharge Summary (Signed)
Patient ID: Steve Rogers MRN: [id number] DOB/AGE: Jan 22, 1966 46 y.o.  Admit date: 12/28/2011 Discharge date: 01/07/2012  Procedures: laparoscopic appendectomy with abscess drainage and placement of a Blake drain  Consults: Internal medicine, Dr. Mahala Menghini  Reason for Admission: [name] who went to [facility] due to abdominal pain.  He began having N/V along with some abdominal pain that progressed during a 3 day period.  He had a CT scan that revealed acute appendicitis.  He was transferred to [facility] for further care.  Admission Diagnoses:  1. Acute appendicitis  Hospital Course: The patient was admitted and taken to the operating room where he was found to have a perforated appendix with an abscess.  He had an appendectomy and drainage of this abscess with placement of a Blake drain.  An NGT was placed intraoperatively.  This remained in for several days post-op, but was eventually removed when his ileus resolved.  Around POD #4, he was started on a diet and this was advanced as tolerated.  He remained on IV Invanz for the duration of his stay.  His surgical drain was removed on POD #5.  On POD# 8, his WBCs were noted to be trending back up to 16K from 13K, despite otherwise feeling great.  A CT scan was ordered and it revealed a large pelvic abscess about 10cm in size.  IR was consulted and a perc drain was placed.  The patient tolerated this well and was able to be dc home on POD# 10 on oral Cirpo and Flagyl. He will be taught how to manage his drain at home.  The patient did have a medicine consult during this admission secondary to uncontrolled HTN.  He was started on Norvasc.  He was informed he needed to obtain a primary medical doctor after discharge.  PE: Abd: soft, NT, ND, +BS, drain with tan milky output.  Discharge Diagnoses:  Principal Problem:  *Acute fulminating appendicitis with perforation and peritonitis Active Problems:  Ileus following gastrointestinal surgery  Nephrolithiasis  BMI 38.0-38.9,adult  HTN (hypertension)  Pre-diabetes  Renal insufficiency Abdominal abscess, s/p perc drain  Discharge Medications: Medication List  As of 01/07/2012 12:10 PM   TAKE these medications         amLODipine 10 MG tablet   Commonly known as: NORVASC   Take 1 tablet (10 mg total) by mouth daily.      ciprofloxacin 500 MG tablet   Commonly known as: CIPRO   Take 1 tablet (500 mg total) by mouth 2 (two) times daily.      METAMUCIL PO   Take 2 capsules by mouth daily as needed. For digestive health.      metroNIDAZOLE 500 MG tablet   Commonly known as: FLAGYL   Take 1 tablet (500 mg total) by mouth 3 (three) times daily.      oxyCODONE-acetaminophen 5-325 MG per tablet   Commonly known as: PERCOCET/ROXICET   Take 1 tablet by mouth every 4 (four) hours as needed for pain.            Discharge Instructions:  He will get a CT scan in about a week and then see Dr. Biagio Quint in the office. Follow-up Information    Follow up with LAYTON, BRIAN DAVID, DO in 2 weeks. (our office will call you)    Contact information:   [address] [place] [address] [phone]       Please follow up. (You need  to get an appointment with a primary care  doctor to address your general medical care and blood pressure.)          Signed: Cordell Coke E 01/07/2012, 12:10 PM

## 2012-01-07 NOTE — Discharge Summary (Signed)
Patient doing well and voices understanding of post discharge care and plans

## 2012-01-08 ENCOUNTER — Other Ambulatory Visit (INDEPENDENT_AMBULATORY_CARE_PROVIDER_SITE_OTHER): Payer: Self-pay

## 2012-01-08 ENCOUNTER — Telehealth (INDEPENDENT_AMBULATORY_CARE_PROVIDER_SITE_OTHER): Payer: Self-pay | Admitting: General Surgery

## 2012-01-08 DIAGNOSIS — K3533 Acute appendicitis with perforation and localized peritonitis, with abscess: Secondary | ICD-10-CM

## 2012-01-08 NOTE — Telephone Encounter (Signed)
Patient's mom called to inquire about a CT scan. He was told he needed to have a CT follow up in one week from now to evaluate patient's drain. He made follow up with Dr Biagio Quint on 01/21/12. Please advise patient of date/time for CT.

## 2012-01-14 ENCOUNTER — Ambulatory Visit
Admission: RE | Admit: 2012-01-14 | Discharge: 2012-01-14 | Disposition: A | Discharge status: Home / Self Care | Payer: No Typology Code available for payment source | Source: Ambulatory Visit | Attending: General Surgery | Admitting: General Surgery

## 2012-01-14 DIAGNOSIS — K3533 Acute appendicitis with perforation and localized peritonitis, with abscess: Secondary | ICD-10-CM

## 2012-01-14 MED ORDER — IOHEXOL 300 MG/ML  SOLN
125.0000 mL | Freq: Once | INTRAMUSCULAR | Status: AC | PRN
  Administered 2012-01-14: 125 mL via INTRAVENOUS

## 2012-01-21 ENCOUNTER — Ambulatory Visit (INDEPENDENT_AMBULATORY_CARE_PROVIDER_SITE_OTHER): Payer: Self-pay | Admitting: General Surgery

## 2012-01-21 ENCOUNTER — Encounter (INDEPENDENT_AMBULATORY_CARE_PROVIDER_SITE_OTHER): Payer: Self-pay | Admitting: General Surgery

## 2012-01-21 VITALS — BP 152/98 | HR 76 | Temp 97.8°F | Resp 16 | Ht 71.75 in | Wt 264.6 lb

## 2012-01-21 DIAGNOSIS — Z4889 Encounter for other specified surgical aftercare: Secondary | ICD-10-CM

## 2012-01-21 DIAGNOSIS — Z5189 Encounter for other specified aftercare: Secondary | ICD-10-CM

## 2012-01-21 MED ORDER — AMLODIPINE BESYLATE 10 MG PO TABS: 10.0000 mg | ORAL_TABLET | Freq: Every day | ORAL | Status: AC

## 2012-01-21 NOTE — Progress Notes (Signed)
Subjective:     Patient ID: Steve Rogers, male   DOB: 12-04-1965, 46 y.o.   MRN: [id number]  HPI  This patient follows up about one month status post laparoscopic appendectomy and drainage of intra-abdominal abscess for perforated appendicitis with peritonitis. He is much improved. He had a somewhat complicated hospital course with a postoperative ileus which subsequently resolved. He did have also an intra-abdominal abscess which required percutaneous drain placement. He has not had much drain output over the last week since his CT scan last week which demonstrated resolution of the abscess cavity where the pigtail catheter was placed. He also had much improvement in the size of a second small abscess cavity. He is off antibiotics to finish these about 3-4 days ago. He hasn't had any abdominal pain, fevers or chills. His appetite has not completely returned to normal but he is tolerating regular diet without difficulty. Review of Systems     Objective:   Physical Exam No distress and nontoxic-appearing His abdomen is soft and nontender on exam his incisions are well-healed without signs of infection. His abdomen is nondistended. He has a pigtail drain from his left buttock which I removed today in clinic.    Assessment:     Status post laparoscopic appendectomy for perforated appendicitis-doing well It appears as though his intra-abdominal abscess has resolved. He has no significant drainage output and no fevers or chills or abdominal pain. We removed his drain today in clinic after a CT scan last week demonstrated resolution of his abscess. I think it is reasonable that he increase his activity as tolerated and he can return to work as well with activity as tolerated. I recommended that if he develops any increasing abdominal pain, fever chills that he calls back for repeat evaluation.    Plan:     Increase activity as tolerated He can follow up with me on a when necessary basis. I recommended  that he established care with her primary care physician for management of his blood pressure. He asked for a refill of his Norvasc which was prescribed in the hospital until he can get established with his primary care physician. I explained that I will provide him one refill of his medication and then this should allow him of time to get established with the primary care physician for further management of his blood pressure.

## 2012-11-02 IMAGING — CT CT ABD-PELV W/ CM
3 of 5 series · 12 of 36 positions shown, 18 images · IV contrast (READICAT/WATER & [ID] OMNI 300)
Comparison: CT abdomen pelvis of 01/05/2012 and CT drain placement
of 01/06/2012

CLINICAL DATA: Status post perforated appendix, evaluate drain and
abscess

CT ABDOMEN AND PELVIS WITH CONTRAST
TECHNIQUE: Multidetector CT imaging of the abdomen and pelvis was
performed following the standard protocol during bolus
administration of intravenous contrast.
Contrast: 125mL OMNIPAQUE IOHEXOL 300 MG/ML  SOLN

[Series 3: abd/pelvis with · axial · 0.82mm/px · z∈[-371,-21]mm · 7 of 94 slices shown, 12 images]
[im 12/94  soft-tissue]
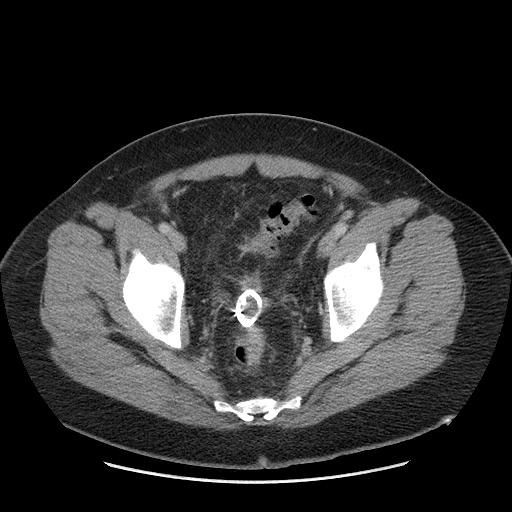
[im 12/94  bone]
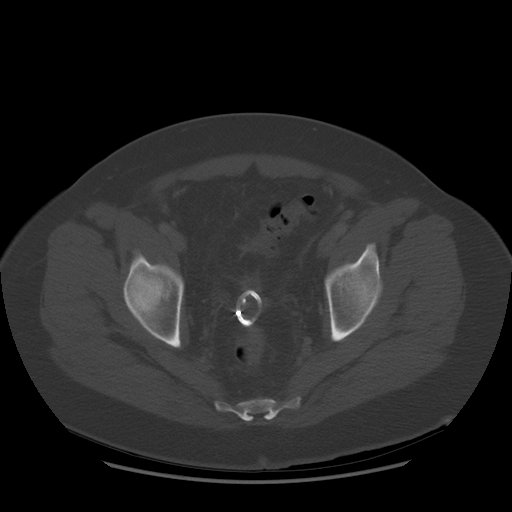
[im 24/94  soft-tissue]
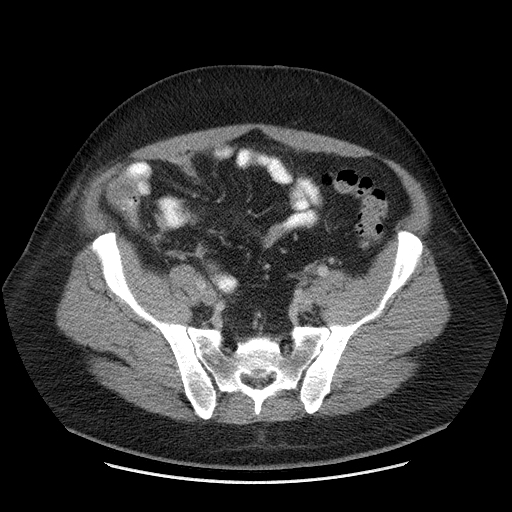
[im 35/94  soft-tissue]
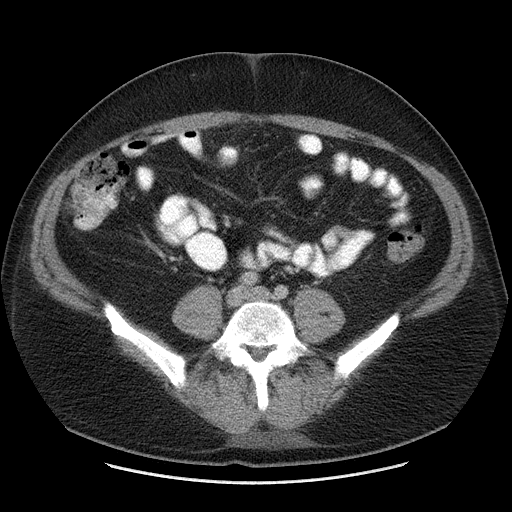
[im 47/94  soft-tissue]
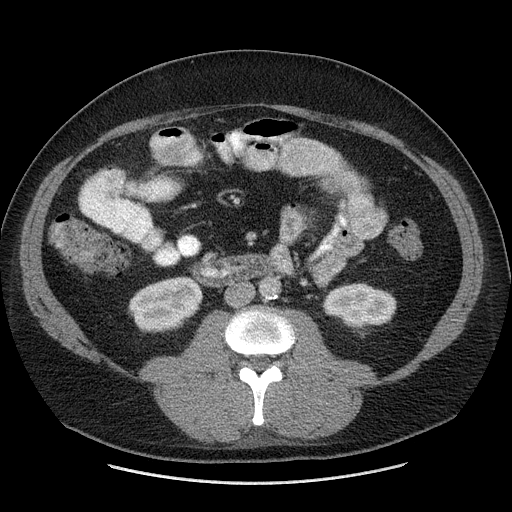
[im 47/94  lung]
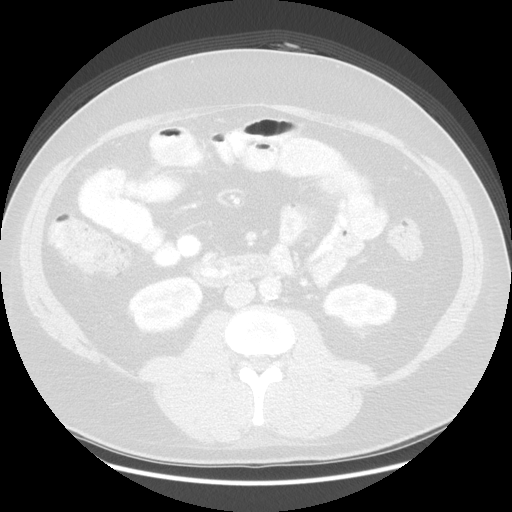
[im 59/94  soft-tissue]
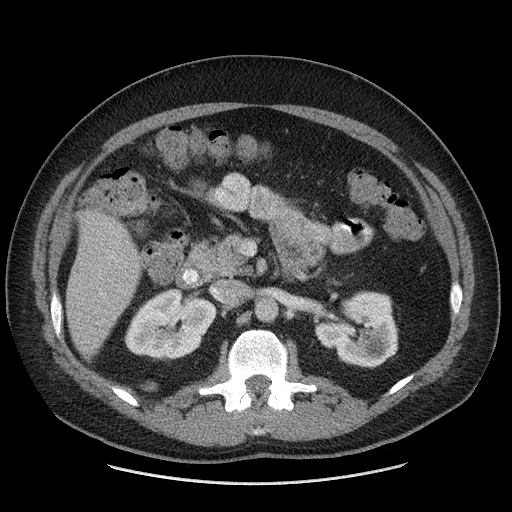
[im 59/94  lung]
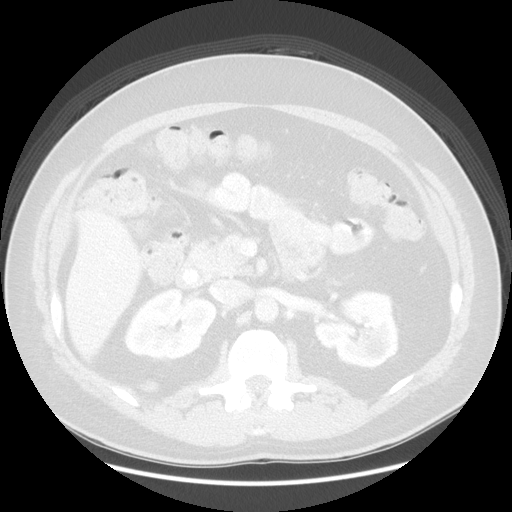
[im 70/94  soft-tissue]
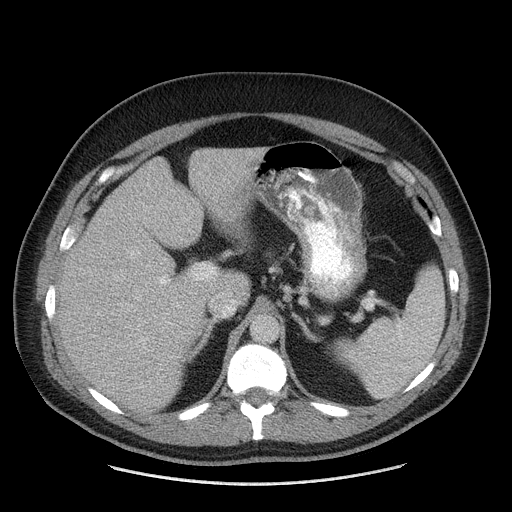
[im 70/94  lung]
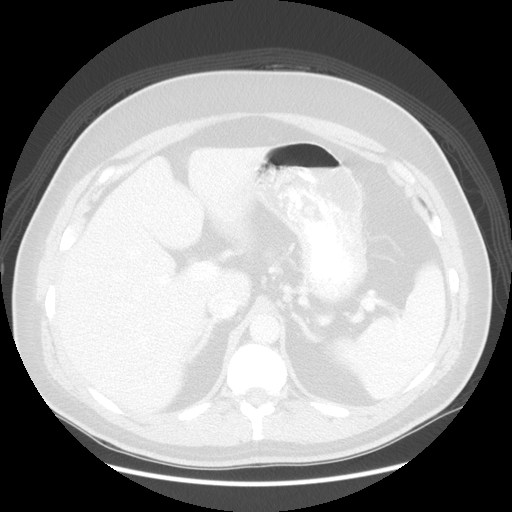
[im 82/94  soft-tissue]
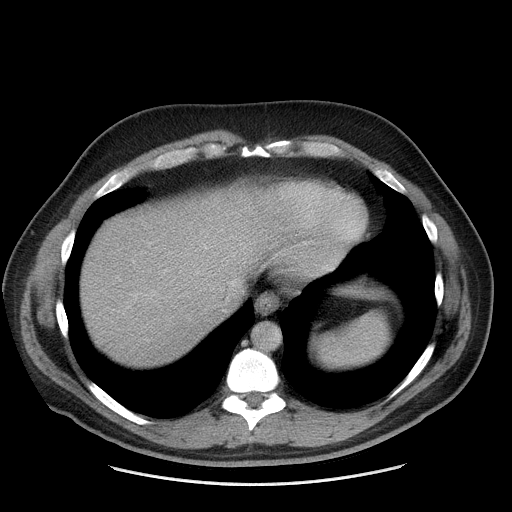
[im 82/94  lung]
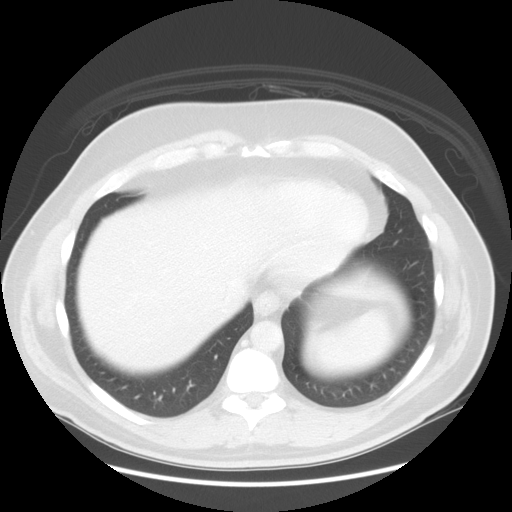

[Series 601: coronal body · coronal · 0.91mm/px · 1 of 141 slices shown, 2 images]
[im 47/141  soft-tissue]
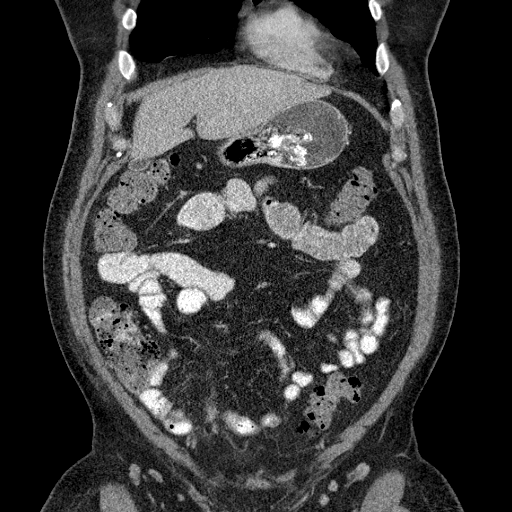
[im 47/141  bone]
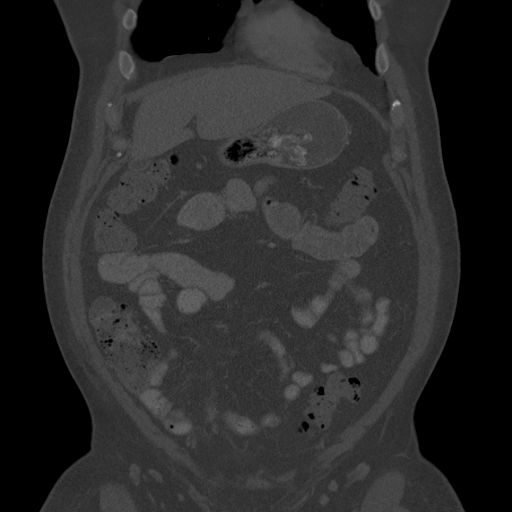

[Series 602: sagittal body · sagittal · 0.91mm/px · 4 of 169 slices shown]
[im 11/169  soft-tissue]
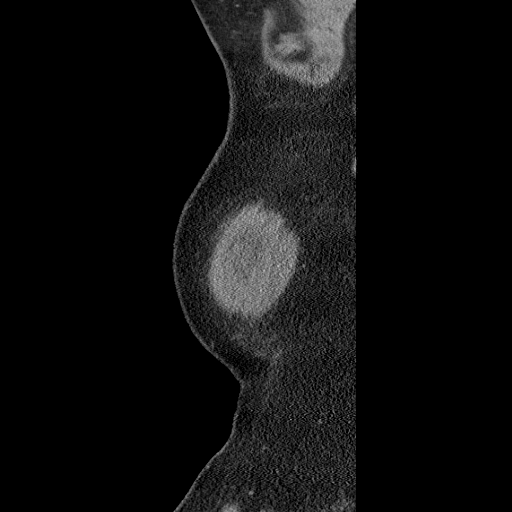
[im 32/169  soft-tissue]
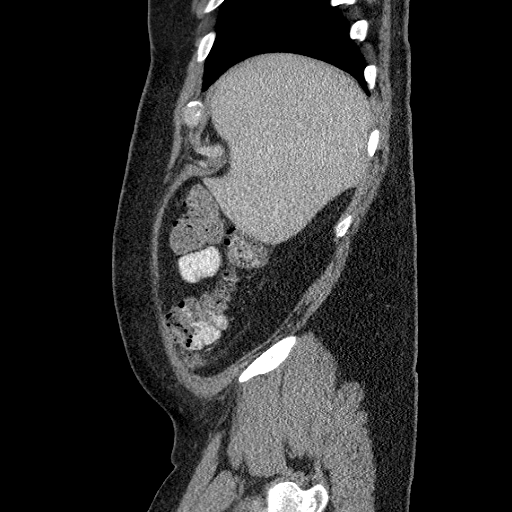
[im 53/169  soft-tissue]
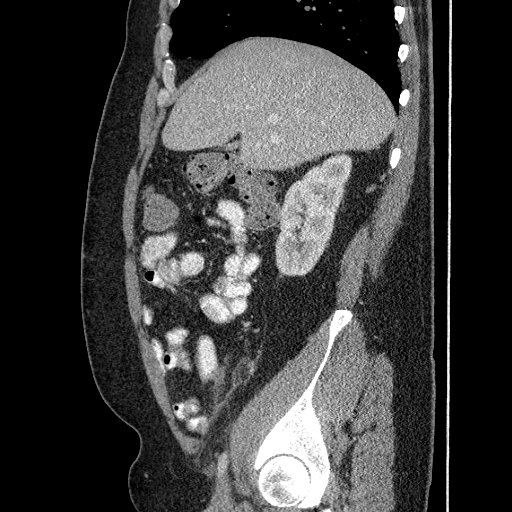
[im 74/169  soft-tissue]
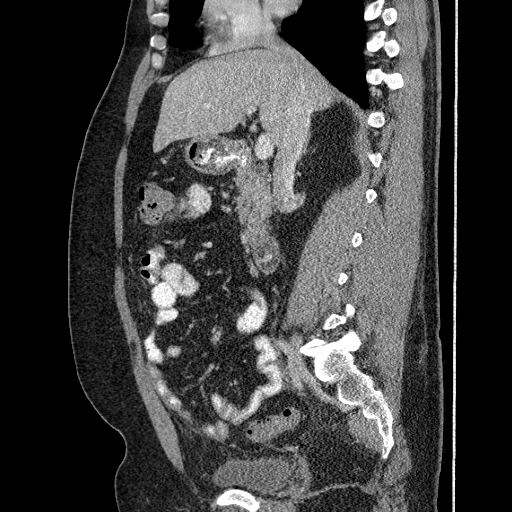

[12 of 36 positions shown; findings below may reference images not displayed]

FINDINGS: The lung bases are clear.  The liver enhances with no
focal abnormality and no ductal dilatation is seen.  The
gallbladder is contracted and no calcified gallstones are noted.
The pancreas is normal in size and the pancreatic duct is not
dilated.  The adrenal glands and spleen are unremarkable.  The
stomach is moderately fluid distended with no abnormality noted.  A
rounded low attenuation structure in the upper pole of the left
kidney is most consistent with cyst. There is a nonobstructing
lower pole left renal calculus of 5 mm in diameter.  The abdominal
aorta is normal in caliber.  No adenopathy is seen.

Surgical clips are present from prior appendectomy.  The pelvic
abscess has been drained, with a percutaneous abscess drainage
catheter remaining anterior to the rectum. A very small abscess
cavity in the mid pelvis has diminished in size, now measuring
approximately 1.2 x 1.4 cm compared to 2.3 x 2.7 cm previously.  No
other abscess cavity is seen and no free fluid is noted within the
pelvis.  Minimal strandiness is noted just anterior to the abscess
drainage catheter.  The urinary bladder is not well distended but
is unremarkable.  There is feces throughout the colon but no
colonic lesion is seen.  The terminal ileum is well opacified and
is unremarkable.
IMPRESSION: .

1.  No residual abscess cavity is seen at the site of the pigtail
drainage catheter anterior to the rectum within the mid pelvis.
2.  Significant decrease in size of the small mid pelvic abscess.
3.  5 mm nonobstructing left lower pole renal calculus.

## 2013-04-14 NOTE — Discharge Summary (Signed)
Agree  Rivka Baune M. Jahaan Vanwagner, M.D., FACS [facility] General and Minimally invasive Surgery Breast and Colorectal Surgery Office:   [phone] Pager:   [phone]  

## 2019-08-27 ENCOUNTER — Ambulatory Visit: Payer: Self-pay

## 2019-08-27 ENCOUNTER — Ambulatory Visit: Payer: MEDICAID | Attending: Internal Medicine

## 2019-08-27 DIAGNOSIS — Z23 Encounter for immunization: Secondary | ICD-10-CM

## 2019-08-27 NOTE — Progress Notes (Signed)
   Covid-19 Vaccination Clinic  Name:  Steve Rogers    MRN: [id number] DOB: 21-Apr-1966  08/27/2019  [name] was observed post Covid-19 immunization for 15 minutes without incident. He was provided with Vaccine Information Sheet and instruction to access the V-Safe system.   [name] was instructed to call 911 with any severe reactions post vaccine: Marland Kitchen Difficulty breathing  . Swelling of face and throat  . A fast heartbeat  . A bad rash all over body  . Dizziness and weakness   Immunizations Administered    Name Date Dose VIS Date Route   Pfizer COVID-19 Vaccine 08/27/2019  8:13 AM 0.3 mL 05/06/2019 Intramuscular   Manufacturer: [employer], [employer]   Lot: [id number]   NDC: [address]-2

## 2019-09-20 ENCOUNTER — Ambulatory Visit: Payer: MEDICAID | Attending: Internal Medicine

## 2019-09-20 DIAGNOSIS — Z23 Encounter for immunization: Secondary | ICD-10-CM

## 2019-09-20 NOTE — Progress Notes (Signed)
   Covid-19 Vaccination Clinic  Name:  Steve Rogers    MRN: [id number] DOB: 1965-06-20  09/20/2019  Mr. Royce was observed post Covid-19 immunization for 15 minutes without incident. He was provided with Vaccine Information Sheet and instruction to access the V-Safe system.   Mr. Knotts was instructed to call 911 with any severe reactions post vaccine: Marland Kitchen Difficulty breathing  . Swelling of face and throat  . A fast heartbeat  . A bad rash all over body  . Dizziness and weakness   Immunizations Administered    Name Date Dose VIS Date Route   Pfizer COVID-19 Vaccine 09/20/2019  8:28 AM 0.3 mL 07/20/2018 Intramuscular   Manufacturer: [employer], [employer]   Lot: [id number]   NDC: [address]-3

## 2023-06-19 ENCOUNTER — Other Ambulatory Visit: Payer: Self-pay
